# Patient Record
Sex: Female | Born: 1935
Health system: Southern US, Community
[De-identification: ages and names within clinical notes are randomized; demographics above are authoritative.]

## PROBLEM LIST (undated history)

## (undated) DIAGNOSIS — I10 Essential (primary) hypertension: Secondary | ICD-10-CM

## (undated) DIAGNOSIS — M810 Age-related osteoporosis without current pathological fracture: Secondary | ICD-10-CM

## (undated) DIAGNOSIS — M1990 Unspecified osteoarthritis, unspecified site: Secondary | ICD-10-CM

## (undated) HISTORY — PX: CHOLECYSTECTOMY: SHX55

## (undated) HISTORY — PX: OVARIAN CYST REMOVAL: SHX89

## (undated) HISTORY — DX: Age-related osteoporosis without current pathological fracture: M81.0

## (undated) HISTORY — PX: EYE SURGERY: SHX253

## (undated) HISTORY — DX: Unspecified osteoarthritis, unspecified site: M19.90

## (undated) HISTORY — PX: APPENDECTOMY: SHX54

## (undated) HISTORY — DX: Essential (primary) hypertension: I10

## (undated) HISTORY — PX: TONSILLECTOMY: SUR1361

---

## 1999-06-30 ENCOUNTER — Other Ambulatory Visit: Admission: RE | Admit: 1999-06-30 | Discharge: 1999-06-30 | Payer: Self-pay | Admitting: Gynecology

## 1999-11-11 ENCOUNTER — Encounter: Admission: RE | Admit: 1999-11-11 | Discharge: 1999-11-11 | Payer: Self-pay | Admitting: Urology

## 1999-11-11 ENCOUNTER — Encounter: Payer: Self-pay | Admitting: Urology

## 2001-02-04 ENCOUNTER — Ambulatory Visit (HOSPITAL_COMMUNITY): Admission: RE | Admit: 2001-02-04 | Discharge: 2001-02-04 | Payer: Self-pay | Admitting: Internal Medicine

## 2001-05-31 ENCOUNTER — Ambulatory Visit (HOSPITAL_COMMUNITY): Admission: RE | Admit: 2001-05-31 | Discharge: 2001-05-31 | Payer: Self-pay | Admitting: Internal Medicine

## 2001-05-31 ENCOUNTER — Encounter: Payer: Self-pay | Admitting: Internal Medicine

## 2001-09-29 ENCOUNTER — Encounter: Payer: Self-pay | Admitting: *Deleted

## 2001-09-29 ENCOUNTER — Ambulatory Visit (HOSPITAL_COMMUNITY): Admission: RE | Admit: 2001-09-29 | Discharge: 2001-09-29 | Payer: Self-pay | Admitting: *Deleted

## 2002-07-31 ENCOUNTER — Other Ambulatory Visit: Admission: RE | Admit: 2002-07-31 | Discharge: 2002-07-31 | Payer: Self-pay | Admitting: Gynecology

## 2003-01-30 ENCOUNTER — Ambulatory Visit (HOSPITAL_COMMUNITY): Admission: RE | Admit: 2003-01-30 | Discharge: 2003-01-30 | Payer: Self-pay | Admitting: Preventative Medicine

## 2003-01-30 ENCOUNTER — Encounter: Payer: Self-pay | Admitting: Preventative Medicine

## 2003-08-27 ENCOUNTER — Other Ambulatory Visit: Admission: RE | Admit: 2003-08-27 | Discharge: 2003-08-27 | Payer: Self-pay | Admitting: Gynecology

## 2003-11-20 ENCOUNTER — Ambulatory Visit (HOSPITAL_COMMUNITY): Admission: RE | Admit: 2003-11-20 | Discharge: 2003-11-20 | Payer: Self-pay | Admitting: Neurology

## 2004-07-08 ENCOUNTER — Ambulatory Visit (HOSPITAL_COMMUNITY): Admission: RE | Admit: 2004-07-08 | Discharge: 2004-07-08 | Payer: Self-pay | Admitting: Internal Medicine

## 2004-08-28 ENCOUNTER — Other Ambulatory Visit: Admission: RE | Admit: 2004-08-28 | Discharge: 2004-08-28 | Payer: Self-pay | Admitting: Gynecology

## 2006-01-19 ENCOUNTER — Ambulatory Visit: Payer: Self-pay | Admitting: Internal Medicine

## 2006-02-03 ENCOUNTER — Ambulatory Visit (HOSPITAL_COMMUNITY): Admission: RE | Admit: 2006-02-03 | Discharge: 2006-02-03 | Payer: Self-pay | Admitting: Internal Medicine

## 2006-02-04 ENCOUNTER — Ambulatory Visit: Payer: Self-pay | Admitting: Internal Medicine

## 2006-02-04 ENCOUNTER — Ambulatory Visit (HOSPITAL_COMMUNITY): Admission: RE | Admit: 2006-02-04 | Discharge: 2006-02-04 | Payer: Self-pay | Admitting: Internal Medicine

## 2006-02-22 ENCOUNTER — Encounter (HOSPITAL_COMMUNITY): Admission: RE | Admit: 2006-02-22 | Discharge: 2006-03-24 | Payer: Self-pay | Admitting: Internal Medicine

## 2006-06-11 ENCOUNTER — Encounter (INDEPENDENT_AMBULATORY_CARE_PROVIDER_SITE_OTHER): Payer: Self-pay | Admitting: Specialist

## 2006-06-11 ENCOUNTER — Ambulatory Visit (HOSPITAL_COMMUNITY): Admission: RE | Admit: 2006-06-11 | Discharge: 2006-06-11 | Payer: Self-pay | Admitting: General Surgery

## 2007-07-12 ENCOUNTER — Ambulatory Visit (HOSPITAL_COMMUNITY): Admission: RE | Admit: 2007-07-12 | Discharge: 2007-07-12 | Payer: Self-pay | Admitting: Internal Medicine

## 2008-02-28 ENCOUNTER — Emergency Department (HOSPITAL_COMMUNITY): Admission: EM | Admit: 2008-02-28 | Discharge: 2008-02-28 | Payer: Self-pay | Admitting: Emergency Medicine

## 2009-08-13 ENCOUNTER — Ambulatory Visit (HOSPITAL_COMMUNITY): Admission: RE | Admit: 2009-08-13 | Discharge: 2009-08-13 | Payer: Self-pay | Admitting: Ophthalmology

## 2009-08-27 ENCOUNTER — Ambulatory Visit (HOSPITAL_COMMUNITY): Admission: RE | Admit: 2009-08-27 | Discharge: 2009-08-27 | Payer: Self-pay | Admitting: Ophthalmology

## 2010-05-11 ENCOUNTER — Encounter: Payer: Self-pay | Admitting: Internal Medicine

## 2010-07-08 LAB — BASIC METABOLIC PANEL
BUN: 20 mg/dL (ref 6–23)
CO2: 25 mEq/L (ref 19–32)
Calcium: 8.9 mg/dL (ref 8.4–10.5)
GFR calc Af Amer: >60 mL/min (ref >60)
Glucose, Bld: 90 mg/dL (ref 70–99)
Potassium: 4.1 mEq/L (ref 3.5–5.1)

## 2010-07-08 LAB — HEMOGLOBIN AND HEMATOCRIT, BLOOD
HCT: 38.6 % (ref 36.0–46.0)
Hemoglobin: 13.5 g/dL (ref 12.0–15.0)

## 2010-09-05 NOTE — Op Note (Signed)
[facility]  Patient:    Rebecca Cordova, Rebecca Cordova [facility] T Visit Number: [id number] MRN: [id number]          Service Type: DSU Location: DAY Attending Physician:  Jonathon Bellows Dictated by:   Roetta Sessions, M.D. Proc. Date: 02/04/01 Admit Date:  02/04/2001 Discharge Date: 02/04/2001   CC:         Carylon Perches, M.D.   Operative Report  PROCEDURE:  Esophagogastroduodenoscopy with [facility] dilation.  ENDOSCOPIST:  Roetta Sessions, M.D.  INDICATION FOR PROCEDURE:  Patient is a 77 year old lady with gastroesophageal reflux symptoms and esophageal dysphagia.  She has been on Nexium 40 mg orally daily recently with improvement in her symptoms.  This approach has been discussed with Ms. Somma.  Potential risks, benefits and alternatives have been reviewed, questions answered and she is agreeable.  Please see my handwritten H&P for more information.  PROCEDURE NOTE:  O2 saturation, blood pressure, pulse and respirations were monitored throughout the entire procedure.  CONSCIOUS SEDATION:  Versed 3 mg IV, Demerol 75 mg IV in divided doses; Cetacaine spray for topical oropharyngeal anesthesia.  INSTRUMENT:  Olympus gastroscope.  FINDINGS:  Examination of the tubular esophagus revealed a couple of tiny distal esophageal erosions and a Schatzkis ring.  No other mucosal abnormalities were noted.  EG junction was easily traversed.  Stomach:  Gastric cavity insufflated well with air.  Thorough examination of the gastric mucosa including a retroflexed view of the proximal stomach and esophagogastric junction demonstrated no abnormalities.  Pyloric channel was patent and easily traversed.  Duodenum:  Bulb and second portion appeared normal.  Therapeutic/diagnostic maneuvers performed:  A 56-French Maloney dilator was passed to full insertion with ease and withdrawn.  I looked back and the esophagus and stomach revealed no apparent complication related to passage of the  dilator.  Patient tolerated the procedure well and was reacted at endoscopy.  IMPRESSION:  A couple of tiny distal esophageal erosions consistent with mild erosive reflux esophagitis; Schatzkis ring, status post dilation as described above; remainder of her upper gastrointestinal tract appeared normal.  RECOMMENDATIONS:  Antireflux measures, continue Nexium 40 mg orally daily, follow up with Dr. Carylon Perches. Dictated by:   Roetta Sessions, M.D. Attending Physician:  Jonathon Bellows DD:  02/07/01 TD:  02/08/01 Job: [id number]

## 2010-09-05 NOTE — Op Note (Signed)
NAMEKayani Cordova, Rebecca Cordova                  ACCOUNT NO.:  [id number]   MEDICAL RECORD NO.:  [id number]          PATIENT TYPE:  AMB   LOCATION:  DAY                           FACILITY:  [facility]   PHYSICIAN:  Dalia Heading, M.D.  DATE OF BIRTH:  04/05/1935   DATE OF PROCEDURE:  06/11/2006  DATE OF DISCHARGE:                               OPERATIVE REPORT   PREOPERATIVE DIAGNOSIS:  Chronic cholecystitis.   POSTOPERATIVE DIAGNOSIS:  Chronic cholecystitis.   PROCEDURE:  Laparoscopic cholecystectomy.   SURGEON:  Dalia Heading, M.D.   ANESTHESIA:  General endotracheal.   INDICATIONS FOR PROCEDURE:  The patient is a 77 year old white female  who was referred for biliary colic secondary to chronic cholecystitis.  The risks and benefits of the procedure including bleeding, infection,  hepatobiliary injury, and the possibly of an open procedure were fully  explained to the patient who gave informed consent.   DESCRIPTION OF PROCEDURE:  The patient was placed in the supine  position.  After induction of general endotracheal anesthesia, the  abdomen was prepped and draped using the usual sterile technique with  Betadine.  Surgical site confirmation was performed.   An infraumbilical incision was made down to the fascia.  A Veress needle  was introduced into the abdominal cavity, and confirmation of placement  was done using the saline drop test.  The abdomen was then insufflated  to 16 mmHg pressure.  A 11-mm trocar was introduced into the abdominal  cavity under direct visualization without difficulty.  The patient was  placed in reverse Trendelenburg position, and an additional 11-mm trocar  was placed in the epigastric region, and 5-mm trocars were placed in the  right upper quadrant and right flank regions.  The liver was inspected  and noted to be within normal limits.  The gallbladder was retracted  superior and laterally.  The dissection was begun around the  infundibulum of the  gallbladder.  The cystic duct was first identified.  Its juncture to the infundibulum was fully identified.  Endoclips were  placed proximally and distally on the cystic duct, and the cystic duct  was divided.  This was likewise done on the cystic artery.  The  gallbladder was then freed away from the gallbladder fossa using Bovie  electrocautery.  The gallbladder was delivered through the epigastric  trocar site using an EndoCatch bag.  The gallbladder fossa was  inspected, and no abnormal bleeding or bile leakage was noted.  Surgicel  was placed in the gallbladder fossa.  All fluid and air were then  evacuated from the abdominal cavity prior to removal of the trocars.   All wounds were irrigated with normal saline.  All wounds were injected  with 0.5% Sensorcaine.  The infraumbilical fascia was reapproximated  using an 0 Vicryl interrupted suture.  All skin incisions were closed  using staples.  Betadine ointment and dry sterile dressings were  applied.   All tape and needle counts were correct at the end of the procedure.  The patient was extubated in the operating room and went back to the  recovery room awake and in stable condition.   COMPLICATIONS:  None.   SPECIMENS:  Gallbladder.   ESTIMATED BLOOD LOSS:  Minimal.      Dalia Heading, M.D.  Electronically Signed     MAJ/MEDQ  D:  06/11/2006  T:  06/11/2006  Job:  [id number]   cc:   Kingsley Callander. Ouida Sills, MD  Fax: [phone]   R. Roetta Sessions, M.D.  [address]  [place]  [place] [address]

## 2010-09-05 NOTE — H&P (Signed)
NAMEGiulietta Cordova, [name]                  ACCOUNT NO.:  [id number]   MEDICAL RECORD NO.:  [id number]          PATIENT TYPE:  AMB   LOCATION:  DAY                           FACILITY:  [facility]   PHYSICIAN:  Dalia Heading, M.D.  DATE OF BIRTH:  04/05/1935   DATE OF ADMISSION:  DATE OF DISCHARGE:                              HISTORY & PHYSICAL   CHIEF COMPLAINT:  Chronic cholecystitis.   HISTORY OF PRESENT ILLNESS:  The patient is a 77 year old white female  who is referred for evaluation and treatment of biliary colic secondary  to chronic cystitis.  She has been having right upper quadrant abdominal  pain, nausea, bloating for many months.  She does have fatty food  intolerance.  No fever, chills, jaundice have been noted.   PAST MEDICAL HISTORY:  Includes hypertension.   PAST SURGICAL HISTORY:  Ovarian cyst removal.   CURRENT MEDICATIONS:  Altace, Nexium.   ALLERGIES:  PENICILLIN.   REVIEW OF SYSTEMS:  This patient smokes less than a half a pack  cigarettes a day.  She denies any alcohol use.  She denies any other  cardiopulmonary difficulties or bleeding disorders.   PHYSICAL EXAMINATION:  GENERAL:  The patient is a well-developed, well-  nourished white female in no acute distress.  HEENT EXAMINATION:  Reveals no scleral icterus.  LUNGS:  Clear to auscultation with equal breath sounds bilaterally.  HEART EXAMINATION:  Reveals a regular rate and rhythm without S3, S4,  murmurs.  ABDOMEN:  Soft, nontender, nondistended.  No hepatosplenomegaly, masses,  hernias identified.  Ultrasound of the gallbladder is negative.  Hepatobiliary tree scan reveals chronic cholecystis with a low  gallbladder ejection fraction.   IMPRESSION:  Chronic cholecystis.   PLAN:  The patient is scheduled for laparoscopic cholecystectomy on  June 11, 2006.  The risks and benefits of the procedure including  bleeding, infection, hepatobiliary injury, and the possibility of an  open procedure were fully  explained to the patient, gave informed  consent.      Dalia Heading, M.D.  Electronically Signed     MAJ/MEDQ  D:  06/08/2006  T:  06/09/2006  Job:  [id number]   cc:   Short Stay Wiliam Ke Ouida Sills, MD  Leonidas Romberg

## 2010-09-05 NOTE — Op Note (Signed)
[name], [name]                  ACCOUNT NO.:  [id number]   MEDICAL RECORD NO.:  [id number]          PATIENT TYPE:  AMB   LOCATION:  DAY                           FACILITY:  [facility]   PHYSICIAN:  R. Roetta Sessions, M.D. DATE OF BIRTH:  04/05/1935   DATE OF PROCEDURE:  02/03/2006  DATE OF DISCHARGE:                                 OPERATIVE REPORT   Diagnostic esophagogastroduodenoscopy followed by colonoscopy.   INDICATIONS FOR PROCEDURE:  The patient is a 77 year old lady with a vague  upper abdominal discomfort, some recurrent reflux symptoms off acid  suppression recently.  She had been taking Fosamax, was also having low-  volume painless hematochezia.  Last colonoscopy was some 6 years ago.  EGD  and colonoscopy are now being done.  This approach has been discussed with  the patient at length.  Potential risks, benefits and alternatives have been  reviewed, questions answered.  She is agreeable.  Please see the  documentation in the medical record.   PROCEDURE NOTE:  O2 saturation, blood pressure, pulse, and respirations were  monitored throughout the entire procedure.  Conscious sedation Versed 4 mg  IV, Demerol 75 mg IV in divided doses.   INSTRUMENT:  Olympus video chip system.   FINDINGS:  EGD:  Examination of the tubular esophagus revealed no mucosal  abnormalities.  EG junction was easily traversed.   Stomach:  The gastric cavity was empty and insufflated well with air.  Throughout examination of the gastric mucosa with retroflexed view of the  proximal stomach and esophagogastric junction demonstrated a moderate-size  hiatal hernia.  Pylorus was patent and easily traversed. Examination of the  bulb and second portion revealed no abnormalities.   THERAPEUTIC/DIAGNOSTIC MANEUVERS:  None.   The patient tolerated the procedure well, and she was prepared for  colonoscopy.  Digital rectal exam revealed no abnormalities.   ENDOSCOPIC FINDINGS:  The prep was good.   Rectum:  Examination of the rectal mucosa including retroflexed view of the  anal verge including en face view of the anal canal demonstrated some  friable anal canal hemorrhoids. Otherwise rectal mucosa appeared entirely  normal.   Colon:  Colonic mucosa was surveyed from the rectosigmoid junction through  the left, transverse and right colon to the area of the appendiceal orifice,  ileocecal valve and cecum.  These structures well seen and photographed for  the record.  From this level, scope was slowly cautiously withdrawn, and all  previously mentioned mucosal surfaces were again seen.  The colonic mucosa  appeared normal.  The patient tolerated both procedures well and was  reactive to endoscopy.   IMPRESSION:  Esophagogastroduodenoscopy:  Normal esophagus, moderately large  sized hiatal hernia, otherwise normal stomach, D1 and D2.   Colonoscopy findings:  Friable anal canal/hemorrhoids, otherwise normal  rectum and normal colon.   RECOMMENDATIONS:  1. Will continue Nexium.  2. Will proceed with gallbladder evaluation via ultrasound.  3. Hemorrhoid literature provided to Ms. Eichelberger.  4. Ten-day course of Anusol HC suppositories 1 per rectum at bedtime.  5. Further recommendations to follow.  Jonathon Bellows, M.D.  Electronically Signed     RMR/MEDQ  D:  02/03/2006  T:  02/04/2006  Job:  [id number]   cc:   Kingsley Callander. Ouida Sills, MD  Fax: [phone]

## 2010-09-05 NOTE — H&P (Signed)
NAME:  Rebecca Cordova, Rebecca Cordova                  ACCOUNT NO.:  [id number]   MEDICAL RECORD NO.:  [id number]           PATIENT TYPE:  AMB   LOCATION:  DAY                           FACILITY:  [facility]   PHYSICIAN:  R. Roetta Sessions, M.D. DATE OF BIRTH:  04/05/1935   DATE OF ADMISSION:  DATE OF DISCHARGE:  LH                                HISTORY & PHYSICAL   CHIEF COMPLAINT:  Upper abdominal pain; intermittent rectal bleeding.   HISTORY OF PRESENT ILLNESS:  Ms. Airyanna Dipalma is a pleasant 77 year old  Caucasian female followed primarily by Dr. Carylon Perches, who came to see me  with a complaint of a several-month history of vague upper abdominal pain.  She has a history of gastroesophageal reflux disease; those symptoms were  well-controlled on Nexium.  She stopped the Nexium a few weeks ago and noted  recurrence of her reflux symptoms.  She has not had any odynophagia or  dysphagia.  She wipes a small amount of blood per rectum on occasion.  She  denies constipation or diarrhea.  Upper abdominal discomfort really does not  necessarily have a postprandial component.  She denies any weight loss, but  states she weighed 160 pounds back in 2003.  She had been taking Fosamax for  osteoporosis, but stopped her medication, thinking it was aggravating her  condition.  She underwent a colonoscopy by me for blood per rectum back in  2001; she was found to have internal hemorrhoid, otherwise a normal rectum  and colon.  She had complained of dysphagia to Dr. Ouida Sills back in 2002 and we  had set her up for an EGD on February 04, 2001.  She did undergo an EGD,  which revealed a couple of distal tiny erosions consistent with mild reflux  esophagitis.  She was found to have a Schatzki's ring.  A 56-French Maloney  dilator was passed; this was associated with resolution in her dysphagia.  She again presented in December 2003 with recurrent rectal bleeding.  My  plan was to perform a sigmoidoscopy on this nice lady to further  evaluate  her distal lower GI tract; she failed to follow through on the examination.  Her gallbladder remains in situ.  She has not had any imaging studies  recently.   Family history is significant for:  Her father succumbed to stomach  cancer, which may have been colon, at age 72.  She has a history of an  uncle and cousin with colon cancer.   PAST MEDICAL HISTORY:  1. Osteoporosis.  2. Gastroesophageal reflux disease.  3. Hypertension.   PAST SURGERY:  1. Appendectomy.  2. Ovarian cyst surgery.  3. EGD and colonoscopy as outlined above.   CURRENT MEDICATIONS:  1. Nexium 40 mg orally daily.  2. Altace 2.5 mg daily.  3. ASA 81 mg daily.  4. Fosamax recently discontinued.   ALLERGIES:  PENICILLIN.   FAMILY HISTORY:  Mother died at age 84, cause unknown.  Father died of a GI  malignancy as outlined above, as did uncle and cousin.   SOCIAL HISTORY:  The  patient is single.  She has 1 child.  She works for  [employer].  She has a dog and 4 cats at home.  She  smokes 6-7 cigarettes a day, has a live-in boyfriend.  No alcohol or illicit  drugs.   REVIEW OF SYSTEMS:  Some weight loss since 2001, none recently.  No fever or  chills.  Dyspnea and chest pain on exertion.  She has not really had any  early satiety, nausea or vomiting, esophageal dysphagia.   PHYSICAL EXAMINATION:  GENERAL:  Physical examination reveals a fretful,  anxious-appearing 77 year old lady.  VITAL SIGNS:  Weight 148, height 5 feet 6.  Temperature 98, BP 148/80, pulse  76.  SKIN:  Warm and dry.  No jaundice.  No cutaneous stigmata of chronic liver  disease.  HEENT:  No scleral icterus.  Oral cavity:  No lesions.  CHEST:  Lungs are clear to auscultation.  HEART:  Regular rate and rhythm without murmur, gallop or rub.  BREASTS:  Deferred.  ABDOMEN:  Non-distended.  Positive bowel sounds.  She has some vague  epigastric tenderness to palpation, no appreciable mass or  organomegaly.  EXTREMITIES:  No edema.  RECTAL:  Deferred until the time of colonoscopy.   IMPRESSION:  Ms. Anely Spiewak is a pleasant 77 year old lady with a several-  week history of vague upper abdominal discomfort that falls into the realm  of dyspepsia.  She has recurrence of reflux symptoms off acid suppression  therapy recently.  She was also taking Fosamax, which has been discontinued  and she has essentially paper hematochezia.  Findings on prior colonoscopy  some 6 years ago are somewhat reassuring.  She has a positive family history  of colon cancer, possibly gastric carcinoma.  Her symptoms do not really  sound necessarily biliary.  She is not taking an nonsteroidal anti-  inflammatory drugs, as she reports.  She needs further evaluation.   RECOMMENDATIONS:  I have offered Ms. Sunderland an EGD and a colonoscopy at the  same time in the near future at [facility].  The potential risks,  benefits and alternatives have been reviewed and questions answered; she is  agreeable.  I will go ahead and give her some samples of Nexium and ask her  to start back on Nexium 40 mg orally daily and I will make further  recommendations in the very near future.      Jonathon Bellows, M.D.  Electronically Signed     RMR/MEDQ  D:  01/19/2006  T:  01/20/2006  Job:  [id number]   cc:   Kingsley Callander. Ouida Sills, MD  Fax: [phone]

## 2010-12-09 ENCOUNTER — Ambulatory Visit (HOSPITAL_COMMUNITY)
Admission: RE | Admit: 2010-12-09 | Discharge: 2010-12-09 | Disposition: A | Discharge status: Home / Self Care | Payer: Medicare Other | Source: Ambulatory Visit | Attending: Preventative Medicine | Admitting: Preventative Medicine

## 2010-12-09 ENCOUNTER — Other Ambulatory Visit (HOSPITAL_COMMUNITY): Payer: Self-pay | Admitting: Preventative Medicine

## 2010-12-09 DIAGNOSIS — H538 Other visual disturbances: Secondary | ICD-10-CM

## 2010-12-09 DIAGNOSIS — T1490XA Injury, unspecified, initial encounter: Secondary | ICD-10-CM

## 2010-12-09 DIAGNOSIS — R42 Dizziness and giddiness: Secondary | ICD-10-CM

## 2010-12-09 DIAGNOSIS — R51 Headache: Secondary | ICD-10-CM | POA: Insufficient documentation

## 2011-01-19 ENCOUNTER — Other Ambulatory Visit: Payer: Self-pay | Admitting: Gynecology

## 2011-01-19 DIAGNOSIS — R928 Other abnormal and inconclusive findings on diagnostic imaging of breast: Secondary | ICD-10-CM

## 2011-01-20 LAB — DIFFERENTIAL
Basophils Absolute: 0
Basophils Relative: 0
Eosinophils Absolute: 0
Eosinophils Relative: 0
Monocytes Absolute: 0.2
Neutro Abs: 14.3 — ABNORMAL HIGH
Neutrophils Relative %: 93 — ABNORMAL HIGH

## 2011-01-20 LAB — CBC
HCT: 44.2
Hemoglobin: 14.8
MCV: 94

## 2011-01-20 LAB — COMPREHENSIVE METABOLIC PANEL
Alkaline Phosphatase: 40
Chloride: 105
GFR calc Af Amer: >60
Glucose, Bld: 128 — ABNORMAL HIGH
Potassium: 4.4
Total Bilirubin: 0.6

## 2011-01-20 LAB — URINE MICROSCOPIC-ADD ON

## 2011-01-20 LAB — URINE CULTURE
Colony Count: NO GROWTH
Culture: NO GROWTH

## 2011-01-20 LAB — URINALYSIS, ROUTINE W REFLEX MICROSCOPIC
Protein, ur: NEGATIVE
Urobilinogen, UA: 0.2
pH: 5

## 2011-01-28 ENCOUNTER — Other Ambulatory Visit: Payer: Self-pay | Admitting: Gynecology

## 2011-01-28 ENCOUNTER — Ambulatory Visit
Admission: RE | Admit: 2011-01-28 | Discharge: 2011-01-28 | Disposition: A | Discharge status: Home / Self Care | Payer: Medicare Other | Source: Ambulatory Visit | Attending: Gynecology | Admitting: Gynecology

## 2011-01-28 DIAGNOSIS — R928 Other abnormal and inconclusive findings on diagnostic imaging of breast: Secondary | ICD-10-CM

## 2011-01-30 ENCOUNTER — Ambulatory Visit (INDEPENDENT_AMBULATORY_CARE_PROVIDER_SITE_OTHER): Payer: Medicare Other | Admitting: Urology

## 2011-01-30 ENCOUNTER — Other Ambulatory Visit: Payer: Self-pay | Admitting: Urology

## 2011-01-30 DIAGNOSIS — N200 Calculus of kidney: Secondary | ICD-10-CM

## 2011-01-30 DIAGNOSIS — N281 Cyst of kidney, acquired: Secondary | ICD-10-CM

## 2011-01-30 DIAGNOSIS — R3129 Other microscopic hematuria: Secondary | ICD-10-CM

## 2011-02-03 ENCOUNTER — Ambulatory Visit (HOSPITAL_COMMUNITY)
Admission: RE | Admit: 2011-02-03 | Discharge: 2011-02-03 | Disposition: A | Discharge status: Home / Self Care | Payer: Medicare Other | Source: Ambulatory Visit | Attending: Urology | Admitting: Urology

## 2011-02-03 ENCOUNTER — Encounter (HOSPITAL_COMMUNITY): Payer: Self-pay

## 2011-02-03 DIAGNOSIS — R9389 Abnormal findings on diagnostic imaging of other specified body structures: Secondary | ICD-10-CM | POA: Insufficient documentation

## 2011-02-03 DIAGNOSIS — N200 Calculus of kidney: Secondary | ICD-10-CM | POA: Insufficient documentation

## 2011-02-03 DIAGNOSIS — R3129 Other microscopic hematuria: Secondary | ICD-10-CM | POA: Insufficient documentation

## 2011-02-03 MED ORDER — IOHEXOL 300 MG/ML  SOLN
125.0000 mL | Freq: Once | INTRAMUSCULAR | Status: AC | PRN
  Administered 2011-02-03: 125 mL via INTRAVENOUS

## 2011-02-03 MED ORDER — IOHEXOL 300 MG/ML  SOLN: 80.0000 mL | Freq: Once | INTRAMUSCULAR | Status: DC | PRN

## 2011-03-06 ENCOUNTER — Ambulatory Visit (INDEPENDENT_AMBULATORY_CARE_PROVIDER_SITE_OTHER): Payer: Medicare Other | Admitting: Urology

## 2011-03-06 DIAGNOSIS — N200 Calculus of kidney: Secondary | ICD-10-CM

## 2011-03-06 DIAGNOSIS — R3129 Other microscopic hematuria: Secondary | ICD-10-CM

## 2011-06-29 DIAGNOSIS — N812 Incomplete uterovaginal prolapse: Secondary | ICD-10-CM | POA: Diagnosis not present

## 2011-07-27 DIAGNOSIS — F411 Generalized anxiety disorder: Secondary | ICD-10-CM | POA: Diagnosis not present

## 2011-07-27 DIAGNOSIS — G459 Transient cerebral ischemic attack, unspecified: Secondary | ICD-10-CM | POA: Diagnosis not present

## 2011-08-27 DIAGNOSIS — N760 Acute vaginitis: Secondary | ICD-10-CM | POA: Diagnosis not present

## 2011-09-07 DIAGNOSIS — N760 Acute vaginitis: Secondary | ICD-10-CM | POA: Diagnosis not present

## 2011-12-14 DIAGNOSIS — N951 Menopausal and female climacteric states: Secondary | ICD-10-CM | POA: Diagnosis not present

## 2011-12-14 DIAGNOSIS — N760 Acute vaginitis: Secondary | ICD-10-CM | POA: Diagnosis not present

## 2011-12-21 ENCOUNTER — Emergency Department (HOSPITAL_COMMUNITY)
Admission: EM | Admit: 2011-12-21 | Discharge: 2011-12-21 | Disposition: A | Discharge status: Home / Self Care | Payer: Medicare Other | Attending: Emergency Medicine | Admitting: Emergency Medicine

## 2011-12-21 ENCOUNTER — Encounter (HOSPITAL_COMMUNITY): Payer: Self-pay | Admitting: Family Medicine

## 2011-12-21 ENCOUNTER — Emergency Department (HOSPITAL_COMMUNITY): Payer: Medicare Other

## 2011-12-21 DIAGNOSIS — S60229A Contusion of unspecified hand, initial encounter: Secondary | ICD-10-CM | POA: Diagnosis not present

## 2011-12-21 DIAGNOSIS — Z880 Allergy status to penicillin: Secondary | ICD-10-CM | POA: Insufficient documentation

## 2011-12-21 DIAGNOSIS — Z9089 Acquired absence of other organs: Secondary | ICD-10-CM | POA: Diagnosis not present

## 2011-12-21 DIAGNOSIS — S60221A Contusion of right hand, initial encounter: Secondary | ICD-10-CM

## 2011-12-21 DIAGNOSIS — Z87891 Personal history of nicotine dependence: Secondary | ICD-10-CM | POA: Diagnosis not present

## 2011-12-21 DIAGNOSIS — S6990XA Unspecified injury of unspecified wrist, hand and finger(s), initial encounter: Secondary | ICD-10-CM | POA: Diagnosis not present

## 2011-12-21 DIAGNOSIS — W2209XA Striking against other stationary object, initial encounter: Secondary | ICD-10-CM | POA: Insufficient documentation

## 2011-12-21 NOTE — ED Provider Notes (Signed)
History     CSN: [id number]  Arrival date & time 12/21/11  0911   First MD Initiated Contact with Patient 12/21/11 [phone]      Chief Complaint  Patient presents with  . Hand Injury    (Consider location/radiation/quality/duration/timing/severity/associated sxs/prior treatment) HPI Comments: Patient is a 78 year old female who. Her right hand on a" a door jam" on yesterday September 1. The patient states that while adjusting the door to a dog kennel her hand slipped and she hit a doorjamb with a good deal of force. Patient states she heard a pop. She had increased pain on yesterday and presents today for evaluation if she is noted increased swelling. The patient denies any previous injury or procedure to the right hand. The patient denies using any blood thinning type medications and denies having any bleeding disorders.  Patient is a 78 y.o. female presenting with hand injury. The history is provided by the patient.  Hand Injury     No past medical history on file.  Past Surgical History  Procedure Date  . Tonsillectomy   . Appendectomy   . Ovarian cyst removal   . Cholecystectomy   . Eye surgery     No family history on file.  History  Substance Use Topics  . Smoking status: Former Smoker -- 20 years    Types: Cigarettes    Quit date: 12/21/2006  . Smokeless tobacco: Not on file  . Alcohol Use: No    OB History    Grav Para Term Preterm Abortions TAB SAB Ect Mult Living                  Review of Systems  Constitutional: Negative for activity change.       All ROS Neg except as noted in HPI  HENT: Negative for nosebleeds and neck pain.   Eyes: Positive for pain. Negative for photophobia and discharge.  Respiratory: Negative for cough, shortness of breath and wheezing.   Cardiovascular: Negative for chest pain and palpitations.  Gastrointestinal: Negative for abdominal pain and blood in stool.  Genitourinary: Negative for dysuria, frequency and hematuria.    Musculoskeletal: Positive for arthralgias. Negative for back pain.  Skin: Negative.   Neurological: Negative for dizziness, seizures and speech difficulty.  Psychiatric/Behavioral: Negative for hallucinations and confusion.    Allergies  Penicillins  Home Medications   Current Outpatient Rx  Name Route Sig Dispense Refill  . ACETAMINOPHEN 500 MG PO TABS Oral Take 1,000 mg by mouth every 6 (six) hours as needed. Pain    . ADULT MULTIVITAMIN W/MINERALS CH Oral Take 1 tablet by mouth daily.    Jeananne Rama SULFATE 0.05-0.25 % OP SOLN Both Eyes Place 2 drops into both eyes 3 (three) times daily as needed. Itchy/Red Eyes      BP 138/66  Pulse 66  Temp 97.5 F (36.4 C) (Oral)  Resp 16  Ht 5\' 6"  (1.676 m)  Wt 140 lb (63.504 kg)  BMI 22.60 kg/m2  SpO2 98%  Physical Exam  Nursing note and vitals reviewed. Constitutional: She is oriented to person, place, and time. She appears well-developed and well-nourished.  Non-toxic appearance.  HENT:  Head: Normocephalic.  Right Ear: Tympanic membrane and external ear normal.  Left Ear: Tympanic membrane and external ear normal.  Eyes: EOM and lids are normal. Pupils are equal, round, and reactive to light.  Neck: Normal range of motion. Neck supple. Carotid bruit is not present.  Cardiovascular: Normal rate, regular rhythm, normal heart  sounds, intact distal pulses and normal pulses.   Pulmonary/Chest: Breath sounds normal. No respiratory distress.       Rhonchi present  Abdominal: Soft. Bowel sounds are normal. There is no tenderness. There is no guarding.  Musculoskeletal: Normal range of motion.       There are multiple degenerative changes of the right and left hand. There is swelling at the MP joint area extending into the dorsum of the hand at the first second and third MP joint areas. There is fair range of motion of the fingers. There is full range of motion of the right wrist. There is no pain in the anatomical snuff box.  There is full range of motion of the right elbow and shoulder. Capillary refill is less than 3 seconds.  Lymphadenopathy:       Head (right side): No submandibular adenopathy present.       Head (left side): No submandibular adenopathy present.    She has no cervical adenopathy.  Neurological: She is alert and oriented to person, place, and time. She has normal strength. No cranial nerve deficit or sensory deficit.  Skin: Skin is warm and dry.  Psychiatric: She has a normal mood and affect. Her speech is normal.    ED Course  Procedures (including critical care time)  Labs Reviewed - No data to display Dg Hand Complete Right  12/21/2011  *RADIOLOGY REPORT*  Clinical Data: Injury to right hand.  RIGHT HAND - COMPLETE 3+ VIEW  Comparison: None.  Findings: No acute fracture or dislocation identified.  There is evidence of significant osteoarthritis involving the first through third digits.  Pattern at the level of the interphalangeal joints is suggestive of erosive osteoarthritis.  No focal bony lesions. Soft tissues are unremarkable.  IMPRESSION: No acute fracture.  Evidence of erosive osteoarthritis.   Original Report Authenticated By: Reola Calkins, M.D.      1. Contusion of right hand       MDM  I have reviewed nursing notes, vital signs, and all appropriate lab and imaging results for this patient.  X-ray of the right hand reveals no acute fracture or dislocation there is significant osteoarthritis changes present with some erosive changes particularly at the interphalangeal joints. The patient has been given the results of the test. She is treated with a Lenora Boys splint and ice pack. The patient is advised to see her primary physician or orthopedics if not improving.      Kathie Dike, [place] 12/21/11 1104

## 2011-12-21 NOTE — ED Notes (Signed)
Pt. C/o right hand pain and swelling after hitting hand on a door jam yesterday.  Right hand presents with edema. No obvious deformity noted.

## 2011-12-21 NOTE — ED Provider Notes (Signed)
Medical screening examination/treatment/procedure(s) were performed by non-physician practitioner and as supervising physician I was immediately available for consultation/collaboration.   Keirah Konitzer, MD 12/21/11 1500 

## 2011-12-21 NOTE — ED Notes (Signed)
Patient with no complaints at this time. Respirations even and unlabored. Skin warm/dry. Discharge instructions reviewed with patient at this time. Patient given opportunity to voice concerns/ask questions. Patient discharged at this time and left Emergency Department with steady gait.   

## 2012-02-03 DIAGNOSIS — E785 Hyperlipidemia, unspecified: Secondary | ICD-10-CM | POA: Diagnosis not present

## 2012-02-03 DIAGNOSIS — Z79899 Other long term (current) drug therapy: Secondary | ICD-10-CM | POA: Diagnosis not present

## 2012-02-03 DIAGNOSIS — G459 Transient cerebral ischemic attack, unspecified: Secondary | ICD-10-CM | POA: Diagnosis not present

## 2012-02-11 DIAGNOSIS — E785 Hyperlipidemia, unspecified: Secondary | ICD-10-CM | POA: Diagnosis not present

## 2012-02-11 DIAGNOSIS — Z23 Encounter for immunization: Secondary | ICD-10-CM | POA: Diagnosis not present

## 2012-02-11 DIAGNOSIS — G459 Transient cerebral ischemic attack, unspecified: Secondary | ICD-10-CM | POA: Diagnosis not present

## 2012-07-22 ENCOUNTER — Ambulatory Visit: Payer: Medicare Other | Admitting: Urology

## 2012-08-01 DIAGNOSIS — Z1231 Encounter for screening mammogram for malignant neoplasm of breast: Secondary | ICD-10-CM | POA: Diagnosis not present

## 2012-08-01 DIAGNOSIS — Z124 Encounter for screening for malignant neoplasm of cervix: Secondary | ICD-10-CM | POA: Diagnosis not present

## 2012-08-01 DIAGNOSIS — Z1212 Encounter for screening for malignant neoplasm of rectum: Secondary | ICD-10-CM | POA: Diagnosis not present

## 2012-08-02 DIAGNOSIS — E785 Hyperlipidemia, unspecified: Secondary | ICD-10-CM | POA: Diagnosis not present

## 2012-08-08 DIAGNOSIS — E785 Hyperlipidemia, unspecified: Secondary | ICD-10-CM | POA: Diagnosis not present

## 2012-08-15 ENCOUNTER — Other Ambulatory Visit (HOSPITAL_COMMUNITY): Payer: Self-pay | Admitting: Internal Medicine

## 2012-08-15 ENCOUNTER — Ambulatory Visit (HOSPITAL_COMMUNITY)
Admission: RE | Admit: 2012-08-15 | Discharge: 2012-08-15 | Disposition: A | Discharge status: Home / Self Care | Payer: Medicare Other | Source: Ambulatory Visit | Attending: Internal Medicine | Admitting: Internal Medicine

## 2012-08-15 DIAGNOSIS — R079 Chest pain, unspecified: Secondary | ICD-10-CM | POA: Insufficient documentation

## 2012-08-15 DIAGNOSIS — M549 Dorsalgia, unspecified: Secondary | ICD-10-CM

## 2012-08-15 DIAGNOSIS — R0781 Pleurodynia: Secondary | ICD-10-CM

## 2012-08-15 DIAGNOSIS — K449 Diaphragmatic hernia without obstruction or gangrene: Secondary | ICD-10-CM | POA: Insufficient documentation

## 2012-08-15 DIAGNOSIS — E785 Hyperlipidemia, unspecified: Secondary | ICD-10-CM | POA: Diagnosis not present

## 2012-08-15 DIAGNOSIS — G459 Transient cerebral ischemic attack, unspecified: Secondary | ICD-10-CM | POA: Diagnosis not present

## 2012-10-24 DIAGNOSIS — L821 Other seborrheic keratosis: Secondary | ICD-10-CM | POA: Diagnosis not present

## 2012-10-24 DIAGNOSIS — I781 Nevus, non-neoplastic: Secondary | ICD-10-CM | POA: Diagnosis not present

## 2012-10-24 DIAGNOSIS — L739 Follicular disorder, unspecified: Secondary | ICD-10-CM | POA: Diagnosis not present

## 2012-10-24 DIAGNOSIS — L570 Actinic keratosis: Secondary | ICD-10-CM | POA: Diagnosis not present

## 2012-12-05 DIAGNOSIS — R350 Frequency of micturition: Secondary | ICD-10-CM | POA: Diagnosis not present

## 2013-01-02 DIAGNOSIS — N812 Incomplete uterovaginal prolapse: Secondary | ICD-10-CM | POA: Diagnosis not present

## 2013-02-06 DIAGNOSIS — Z23 Encounter for immunization: Secondary | ICD-10-CM | POA: Diagnosis not present

## 2013-02-14 DIAGNOSIS — G459 Transient cerebral ischemic attack, unspecified: Secondary | ICD-10-CM | POA: Diagnosis not present

## 2013-02-21 DIAGNOSIS — H43819 Vitreous degeneration, unspecified eye: Secondary | ICD-10-CM | POA: Diagnosis not present

## 2013-04-18 DIAGNOSIS — J210 Acute bronchiolitis due to respiratory syncytial virus: Secondary | ICD-10-CM | POA: Diagnosis not present

## 2013-05-04 DIAGNOSIS — N812 Incomplete uterovaginal prolapse: Secondary | ICD-10-CM | POA: Diagnosis not present

## 2013-05-09 DIAGNOSIS — N8110 Cystocele, unspecified: Secondary | ICD-10-CM | POA: Diagnosis not present

## 2013-05-22 DIAGNOSIS — L821 Other seborrheic keratosis: Secondary | ICD-10-CM | POA: Diagnosis not present

## 2013-05-22 DIAGNOSIS — L570 Actinic keratosis: Secondary | ICD-10-CM | POA: Diagnosis not present

## 2013-05-22 DIAGNOSIS — D235 Other benign neoplasm of skin of trunk: Secondary | ICD-10-CM | POA: Diagnosis not present

## 2013-05-22 DIAGNOSIS — L723 Sebaceous cyst: Secondary | ICD-10-CM | POA: Diagnosis not present

## 2013-05-23 DIAGNOSIS — N8110 Cystocele, unspecified: Secondary | ICD-10-CM | POA: Diagnosis not present

## 2013-05-31 DIAGNOSIS — N814 Uterovaginal prolapse, unspecified: Secondary | ICD-10-CM | POA: Diagnosis not present

## 2013-06-19 DIAGNOSIS — K529 Noninfective gastroenteritis and colitis, unspecified: Secondary | ICD-10-CM | POA: Diagnosis not present

## 2013-06-19 DIAGNOSIS — R079 Chest pain, unspecified: Secondary | ICD-10-CM | POA: Diagnosis not present

## 2013-06-20 DIAGNOSIS — K219 Gastro-esophageal reflux disease without esophagitis: Secondary | ICD-10-CM | POA: Diagnosis not present

## 2013-06-22 DIAGNOSIS — K219 Gastro-esophageal reflux disease without esophagitis: Secondary | ICD-10-CM | POA: Diagnosis not present

## 2013-06-22 DIAGNOSIS — M1990 Unspecified osteoarthritis, unspecified site: Secondary | ICD-10-CM | POA: Diagnosis not present

## 2013-06-27 DIAGNOSIS — N760 Acute vaginitis: Secondary | ICD-10-CM | POA: Diagnosis not present

## 2013-06-27 DIAGNOSIS — N814 Uterovaginal prolapse, unspecified: Secondary | ICD-10-CM | POA: Diagnosis not present

## 2013-08-09 DIAGNOSIS — G459 Transient cerebral ischemic attack, unspecified: Secondary | ICD-10-CM | POA: Diagnosis not present

## 2013-08-09 DIAGNOSIS — F411 Generalized anxiety disorder: Secondary | ICD-10-CM | POA: Diagnosis not present

## 2013-08-09 DIAGNOSIS — E785 Hyperlipidemia, unspecified: Secondary | ICD-10-CM | POA: Diagnosis not present

## 2013-08-09 DIAGNOSIS — Z79899 Other long term (current) drug therapy: Secondary | ICD-10-CM | POA: Diagnosis not present

## 2013-08-17 DIAGNOSIS — F411 Generalized anxiety disorder: Secondary | ICD-10-CM | POA: Diagnosis not present

## 2013-08-17 DIAGNOSIS — G459 Transient cerebral ischemic attack, unspecified: Secondary | ICD-10-CM | POA: Diagnosis not present

## 2013-08-29 DIAGNOSIS — N939 Abnormal uterine and vaginal bleeding, unspecified: Secondary | ICD-10-CM | POA: Diagnosis not present

## 2013-08-29 DIAGNOSIS — N926 Irregular menstruation, unspecified: Secondary | ICD-10-CM | POA: Diagnosis not present

## 2014-01-30 DIAGNOSIS — Z23 Encounter for immunization: Secondary | ICD-10-CM | POA: Diagnosis not present

## 2014-02-19 DIAGNOSIS — Z8673 Personal history of transient ischemic attack (TIA), and cerebral infarction without residual deficits: Secondary | ICD-10-CM | POA: Diagnosis not present

## 2014-02-19 DIAGNOSIS — F419 Anxiety disorder, unspecified: Secondary | ICD-10-CM | POA: Diagnosis not present

## 2014-02-28 DIAGNOSIS — N812 Incomplete uterovaginal prolapse: Secondary | ICD-10-CM | POA: Diagnosis not present

## 2014-08-15 DIAGNOSIS — G459 Transient cerebral ischemic attack, unspecified: Secondary | ICD-10-CM | POA: Diagnosis not present

## 2014-08-15 DIAGNOSIS — E785 Hyperlipidemia, unspecified: Secondary | ICD-10-CM | POA: Diagnosis not present

## 2014-08-15 DIAGNOSIS — Z79899 Other long term (current) drug therapy: Secondary | ICD-10-CM | POA: Diagnosis not present

## 2014-08-21 DIAGNOSIS — F419 Anxiety disorder, unspecified: Secondary | ICD-10-CM | POA: Diagnosis not present

## 2014-08-21 DIAGNOSIS — Z8673 Personal history of transient ischemic attack (TIA), and cerebral infarction without residual deficits: Secondary | ICD-10-CM | POA: Diagnosis not present

## 2014-09-11 DIAGNOSIS — N814 Uterovaginal prolapse, unspecified: Secondary | ICD-10-CM | POA: Diagnosis not present

## 2014-09-23 DIAGNOSIS — S2232XA Fracture of one rib, left side, initial encounter for closed fracture: Secondary | ICD-10-CM | POA: Diagnosis not present

## 2014-09-23 DIAGNOSIS — K449 Diaphragmatic hernia without obstruction or gangrene: Secondary | ICD-10-CM | POA: Diagnosis not present

## 2014-09-23 DIAGNOSIS — W1800XA Striking against unspecified object with subsequent fall, initial encounter: Secondary | ICD-10-CM | POA: Diagnosis not present

## 2014-09-23 DIAGNOSIS — M549 Dorsalgia, unspecified: Secondary | ICD-10-CM | POA: Diagnosis not present

## 2014-09-23 DIAGNOSIS — Z8673 Personal history of transient ischemic attack (TIA), and cerebral infarction without residual deficits: Secondary | ICD-10-CM | POA: Diagnosis not present

## 2014-09-23 DIAGNOSIS — R0789 Other chest pain: Secondary | ICD-10-CM | POA: Diagnosis not present

## 2014-12-20 DIAGNOSIS — M79622 Pain in left upper arm: Secondary | ICD-10-CM | POA: Diagnosis not present

## 2014-12-20 DIAGNOSIS — Z8719 Personal history of other diseases of the digestive system: Secondary | ICD-10-CM | POA: Diagnosis not present

## 2014-12-20 DIAGNOSIS — Z6823 Body mass index (BMI) 23.0-23.9, adult: Secondary | ICD-10-CM | POA: Diagnosis not present

## 2015-01-31 DIAGNOSIS — Z23 Encounter for immunization: Secondary | ICD-10-CM | POA: Diagnosis not present

## 2015-02-14 ENCOUNTER — Other Ambulatory Visit (HOSPITAL_COMMUNITY): Payer: Self-pay | Admitting: Orthopaedic Surgery

## 2015-02-14 DIAGNOSIS — M25512 Pain in left shoulder: Secondary | ICD-10-CM

## 2015-02-14 DIAGNOSIS — Z8719 Personal history of other diseases of the digestive system: Secondary | ICD-10-CM | POA: Diagnosis not present

## 2015-02-14 DIAGNOSIS — M79622 Pain in left upper arm: Secondary | ICD-10-CM | POA: Diagnosis not present

## 2015-02-14 DIAGNOSIS — Z6823 Body mass index (BMI) 23.0-23.9, adult: Secondary | ICD-10-CM | POA: Diagnosis not present

## 2015-02-14 DIAGNOSIS — M25561 Pain in right knee: Secondary | ICD-10-CM | POA: Diagnosis not present

## 2015-02-28 ENCOUNTER — Other Ambulatory Visit (HOSPITAL_COMMUNITY): Payer: Self-pay | Admitting: Internal Medicine

## 2015-02-28 DIAGNOSIS — Z8673 Personal history of transient ischemic attack (TIA), and cerebral infarction without residual deficits: Secondary | ICD-10-CM | POA: Diagnosis not present

## 2015-02-28 DIAGNOSIS — F419 Anxiety disorder, unspecified: Secondary | ICD-10-CM | POA: Diagnosis not present

## 2015-02-28 DIAGNOSIS — Z6823 Body mass index (BMI) 23.0-23.9, adult: Secondary | ICD-10-CM | POA: Diagnosis not present

## 2015-02-28 DIAGNOSIS — Z1231 Encounter for screening mammogram for malignant neoplasm of breast: Secondary | ICD-10-CM

## 2015-03-07 ENCOUNTER — Ambulatory Visit (HOSPITAL_COMMUNITY)
Admission: RE | Admit: 2015-03-07 | Discharge: 2015-03-07 | Disposition: A | Discharge status: Home / Self Care | Payer: Medicare Other | Source: Ambulatory Visit | Attending: Orthopaedic Surgery | Admitting: Orthopaedic Surgery

## 2015-03-07 DIAGNOSIS — M7552 Bursitis of left shoulder: Secondary | ICD-10-CM | POA: Insufficient documentation

## 2015-03-07 DIAGNOSIS — M75122 Complete rotator cuff tear or rupture of left shoulder, not specified as traumatic: Secondary | ICD-10-CM | POA: Diagnosis not present

## 2015-03-07 DIAGNOSIS — M19012 Primary osteoarthritis, left shoulder: Secondary | ICD-10-CM | POA: Diagnosis not present

## 2015-03-07 DIAGNOSIS — M25512 Pain in left shoulder: Secondary | ICD-10-CM | POA: Diagnosis not present

## 2015-03-11 DIAGNOSIS — M25512 Pain in left shoulder: Secondary | ICD-10-CM | POA: Diagnosis not present

## 2015-03-11 DIAGNOSIS — Z6823 Body mass index (BMI) 23.0-23.9, adult: Secondary | ICD-10-CM | POA: Diagnosis not present

## 2015-03-11 DIAGNOSIS — Z8719 Personal history of other diseases of the digestive system: Secondary | ICD-10-CM | POA: Diagnosis not present

## 2015-03-11 DIAGNOSIS — M79622 Pain in left upper arm: Secondary | ICD-10-CM | POA: Diagnosis not present

## 2015-03-12 ENCOUNTER — Encounter (HOSPITAL_COMMUNITY): Payer: Self-pay

## 2015-03-12 ENCOUNTER — Ambulatory Visit (HOSPITAL_COMMUNITY): Payer: Medicare Other | Attending: Orthopaedic Surgery

## 2015-03-12 DIAGNOSIS — M25512 Pain in left shoulder: Secondary | ICD-10-CM

## 2015-03-12 DIAGNOSIS — M25612 Stiffness of left shoulder, not elsewhere classified: Secondary | ICD-10-CM

## 2015-03-12 DIAGNOSIS — M629 Disorder of muscle, unspecified: Secondary | ICD-10-CM | POA: Diagnosis not present

## 2015-03-12 DIAGNOSIS — M6289 Other specified disorders of muscle: Secondary | ICD-10-CM

## 2015-03-12 DIAGNOSIS — R29898 Other symptoms and signs involving the musculoskeletal system: Secondary | ICD-10-CM

## 2015-03-12 NOTE — Therapy (Signed)
[facility] [address] [place], [place], [address] Phone: [phone]   Fax:  [phone]  Occupational Therapy Evaluation  Patient Details  Name: Rebecca Cordova MRN: [id number] Date of Birth: 06-26-1932 Referring Provider: Sanjuana Cordova  Encounter Date: 03/12/2015      OT End of Session - 03/12/15 1221    Visit Number 1   Number of Visits 16   Date for OT Re-Evaluation 05/11/15  mini reassess: 04/09/15   Authorization Type Medicare part A   Authorization Time Period before 10th visit   Authorization - Visit Number 1   Authorization - Number of Visits 10   OT Start Time 1105   OT Stop Time 1148   OT Time Calculation (min) 43 min   Activity Tolerance Patient tolerated treatment well   Behavior During Therapy [name] for tasks assessed/performed      History reviewed. No pertinent past medical history.  Past Surgical History  Procedure Laterality Date  . Tonsillectomy    . Appendectomy    . Ovarian cyst removal    . Cholecystectomy    . Eye surgery      There were no vitals filed for this visit.  Visit Diagnosis:  Pain in joint of left shoulder - Plan: Ot plan of care cert/re-cert  Shoulder weakness - Plan: Ot plan of care cert/re-cert  Tight fascia - Plan: Ot plan of care cert/re-cert  Shoulder stiffness, left - Plan: Ot plan of care cert/re-cert      Subjective Assessment - 03/12/15 1108    Subjective  S: I am always on the go. I'm not sure if I did something to it when I was gardening.    Pertinent History Patient is a 81 y/o female S/P left shoulder pain which began approx. 6 weeks ago. Pt believes it may have started after a day of heavy gardening tasks. An X-ray was completed on 03/07/15 which showed a complete supraspinatus tendon tear, moderate to moderately severe acromioclavicular osteoarthritis, Subacromial/subdeltoid fluid compatible with bursitis. Dr. Luna Cordova has referred patient to occupational therapy for  evaluation and treatment.    Special Tests FOTO score: 40/100   Currently in Pain? Yes   Pain Score 6    Pain Location Shoulder   Pain Orientation Left   Pain Type Acute pain           [facility] OT Assessment - 03/12/15 1105    Assessment   Diagnosis left shoulder pain   Referring Provider Rebecca Cordova   Onset Date --  6 weeks ago   Prior Therapy None   Precautions   Precautions None   Restrictions   Weight Bearing Restrictions No   Balance Screen   Has the patient fallen in the past 6 months Yes   How many times? 1   Has the patient had a decrease in activity level because of a fear of falling?  No   Is the patient reluctant to leave their home because of a fear of falling?  No   Home  Environment   Family/patient expects to be discharged to: Private residence   Lives With Spouse   Prior Function   Level of [place] Retired   Leisure Very busy with household chores and yard work.   ADL   ADL comments Difficulty reaching out to the side especially when completing external rotation. Difficulty reaching back to pull pants over hips (internal rotation).   Mobility   Mobility Status History  of falls   Written Expression   Dominant Hand Right   Vision - History   Baseline Vision No visual deficits   Cognition   Overall Cognitive Status Within Functional Limits for tasks assessed   ROM / Strength   AROM / PROM / Strength AROM;Strength   Palpation   Palpation comment Max fascial restrictions in left upper arm, trapezius, and scapularis region.   AROM   Overall AROM Comments Assessed seated. er/IR adducted.   AROM Assessment Site Shoulder   Right/Left Shoulder Left   Left Shoulder Flexion 122 Degrees   Left Shoulder ABduction 94 Degrees   Left Shoulder Internal Rotation 90 Degrees   Left Shoulder External Rotation 65 Degrees   Strength   Overall Strength Comments Assessed seated. er/IR adducted.   Strength Assessment Site Shoulder    Right/Left Shoulder Left   Left Shoulder Flexion 3+/5   Left Shoulder ABduction 3-/5   Left Shoulder Internal Rotation 3/5   Left Shoulder External Rotation 4+/5                         OT Education - 03/12/15 1219    Education provided Yes   Education Details Shoulder stretches   Person(s) Educated Patient   Methods Explanation;Demonstration;Handout;Verbal cues;Tactile cues   Comprehension Returned demonstration;Verbalized understanding          OT Short Term Goals - 03/12/15 1225    OT SHORT TERM GOAL #1   Title Patient will be educated and independent with HEP to increase functional performance when using LUE.   Time 3   Period Weeks   Status New   OT SHORT TERM GOAL #2   Title Patient will increase P/ROM to WNL to increase ability to reach behind back to pull up pants.    Time 3   Period Weeks   Status New   OT SHORT TERM GOAL #3   Title Patient will increase LUE strength to 4/5 to increase ability to return to light household chores.    Time 3   Period Weeks   Status New   OT SHORT TERM GOAL #4   Title Patient will decrease pain level to 4/10 when completing movements that require her to reach out to the side.    Time 3   Period Weeks   Status New   OT SHORT TERM GOAL #5   Title Patient will decrease fascial restrictions from a max amount to a mod amount.   Time 3   Period Weeks   Status New           OT Long Term Goals - 03/12/15 1228    OT LONG TERM GOAL #1   Title Patient will return to her highest level of independence with all daily and househould activities using her LUE.   Time 6   Period Weeks   Status New   OT LONG TERM GOAL #2   Title Patient will increase LUE strength to 4+/5 to increase ability to return to normal daily and household tasks.   Time 6   Period Weeks   Status New   OT LONG TERM GOAL #3   Title Patient will increase A/ROM to [facility] to increase ability to complete dressing tasks with less difficulty.    Time 6    Period Weeks   Status New   OT LONG TERM GOAL #4   Title Patient will decrease pain level to 2/10 or less when using LUE during  daily tasks.    Time 6   Period Weeks   Status New   OT LONG TERM GOAL #5   Title Patient will decrease fascial restrictions to a min amount to increase functional mobility of LUE.   Time 6   Period Weeks   Status New               Plan - 04/01/15 1222    Clinical Impression Statement A: Patient is a 81 y/o female S/P left shoulder pain causing increased fascial restrictions and pain and decreased strength and ROM resulting in difficulty completing daily tasks using LUE.    Pt will benefit from skilled therapeutic intervention in order to improve on the following deficits (Retired) Decreased strength;Pain;Increased fascial restricitons;Decreased range of motion   Rehab Potential Excellent   Clinical Impairments Affecting Rehab Potential Patient a history of back pain and falls.   OT Frequency 2x / week   OT Duration 6 weeks   OT Treatment/Interventions Self-care/ADL training;Cryotherapy;Moist Heat;Electrical Stimulation;Therapeutic exercise;Therapeutic activities;Manual Therapy;Passive range of motion;Patient/family education;Ultrasound   Plan P: patient will benefit from skilled OT services to increase functional performance during daily tasks using LUE. Treatment Plan: myofascial release, passive stretching, A/ROM, shoulder strengthening and scapular strengthening.    Consulted and Agree with Plan of Care Patient          G-Codes - 2015/04/01 1230-08-10    Functional Assessment Tool Used FOTO score: 40/100 (60% impaired)   Functional Limitation Carrying, moving and handling objects   Carrying, Moving and Handling Objects Current Status [id number]) At least 60 percent but less than 80 percent impaired, limited or restricted   Carrying, Moving and Handling Objects Goal Status [id number]) At least 20 percent but less than 40 percent impaired, limited or restricted       Problem List There are no active problems to display for this patient.   Ailene Ravel, OTR/L,CBIS  [phone]  04-01-15, 12:35 PM  [facility] [address] [place], [place], [address] Phone: [phone]   Fax:  [phone]  Name: Rebecca Cordova MRN: [id number] Date of Birth: 1933/02/19

## 2015-03-12 NOTE — Patient Instructions (Signed)
Completed these stretches 2-3 times a day.  Flexibility: Corner Stretch   Standing in corner with hands just above shoulder level and feet ____ inches from corner, lean forward until a comfortable stretch is felt across chest. Hold _10___ seconds. Repeat _3___ times per set. Do ____ sets per session. Do ____ sessions per day.  http://orth.exer.us/342   Copyright  VHI. All rights reserved.   Scapular Retraction (Standing)   With arms at sides, pinch shoulder blades together. Repeat _10___ times per set. Do ____ sets per session. Do ____ sessions per day.    Posterior Capsule Stretch   Stand or sit, one arm across body so hand rests over opposite shoulder. Gently push on crossed elbow with other hand until stretch is felt in shoulder of crossed arm. Hold _10__ seconds.  Repeat _3__ times per session. Do ___ sessions per day.  Copyright  VHI. All rights reserved.   Internal Rotation Across Back  Grab the end of a towel with your affected side, palm facing backwards. Grab the towel with your unaffected side and pull your affected hand across your back until you feel a stretch in the front of your shoulder. If you feel pain, pull just to the pain, do not pull through the pain. Hold. Return your affected arm to your side. Try to keep your hand/arm close to your body during the entire movement.  Hold 10 seconds. Complete 3 times.  Flexors Stretch, Standing   Stand near wall and slide arm up, with palm facing away from wall, by leaning toward wall. Hold _10__ seconds.  Repeat _3__ times per session. Do ___ sessions per day.  Copyright  VHI. All rights reserved.

## 2015-03-18 ENCOUNTER — Ambulatory Visit (HOSPITAL_COMMUNITY)
Admission: RE | Admit: 2015-03-18 | Discharge: 2015-03-18 | Disposition: A | Discharge status: Home / Self Care | Payer: Medicare Other | Source: Ambulatory Visit | Attending: Internal Medicine | Admitting: Internal Medicine

## 2015-03-18 DIAGNOSIS — Z1231 Encounter for screening mammogram for malignant neoplasm of breast: Secondary | ICD-10-CM | POA: Diagnosis not present

## 2015-03-20 ENCOUNTER — Ambulatory Visit (HOSPITAL_COMMUNITY): Payer: Medicare Other | Admitting: Specialist

## 2015-03-20 DIAGNOSIS — R29898 Other symptoms and signs involving the musculoskeletal system: Secondary | ICD-10-CM | POA: Diagnosis not present

## 2015-03-20 DIAGNOSIS — M629 Disorder of muscle, unspecified: Secondary | ICD-10-CM | POA: Diagnosis not present

## 2015-03-20 DIAGNOSIS — M6289 Other specified disorders of muscle: Secondary | ICD-10-CM

## 2015-03-20 DIAGNOSIS — M25512 Pain in left shoulder: Secondary | ICD-10-CM | POA: Diagnosis not present

## 2015-03-20 DIAGNOSIS — M25612 Stiffness of left shoulder, not elsewhere classified: Secondary | ICD-10-CM

## 2015-03-20 NOTE — Therapy (Signed)
[place], [place], [address] Phone: [phone]   Fax:  [phone]  Occupational Therapy Treatment  Patient Details  Name: Rebecca Cordova MRN: [id number] Date of Birth: [birth year] Referring Provider: Sanjuana Kava  Encounter Date: 03/20/2015      OT End of Session - 03/20/15 1417    Visit Number 2   Number of Visits 16   Date for OT Re-Evaluation 05/11/15  mini reassess on 12/20   Authorization Type Medicare part A   Authorization Time Period before 10th visit   Authorization - Visit Number 2   Authorization - Number of Visits 10   OT Start Time 1350   OT Stop Time 1430   OT Time Calculation (min) 40 min   Activity Tolerance Patient tolerated treatment well   Behavior During Therapy [facility] for tasks assessed/performed      No past medical history on file.  Past Surgical History  Procedure Laterality Date  . Tonsillectomy    . Appendectomy    . Ovarian cyst removal    . Cholecystectomy    . Eye surgery      There were no vitals filed for this visit.  Visit Diagnosis:  Pain in joint of left shoulder  Shoulder weakness  Tight fascia  Shoulder stiffness, left      Subjective Assessment - 03/20/15 1353    Subjective  S:  It was feeling better and then I used it too much and it is a little sore.  Reaching behind me to put my coat on and if i bear weight through it.    Currently in Pain? Yes   Pain Score 4    Pain Location Shoulder   Pain Orientation Left   Pain Descriptors / Indicators Aching   Pain Type Acute pain            [facility] OT Assessment - 03/20/15 0001    Assessment   Diagnosis left shoulder pain   Precautions   Precautions None                  OT Treatments/Exercises (OP) - 03/20/15 0001    Exercises   Exercises Shoulder   Shoulder Exercises: Supine   Protraction PROM;5 reps;AAROM;10 reps   Horizontal ABduction PROM;5 reps;AAROM;10 reps   External Rotation  PROM;5 reps;AAROM;10 reps   Internal Rotation PROM;5 reps;AAROM;10 reps   Flexion PROM;5 reps;AAROM;10 reps   ABduction PROM;5 reps;AAROM;10 reps   Other Supine Exercises serratus anterior punch 10 times    Shoulder Exercises: Seated   Protraction AAROM;10 reps   Horizontal ABduction AAROM;10 reps   External Rotation AAROM;10 reps   Internal Rotation AAROM;10 reps   Flexion AAROM;10 reps   Abduction AAROM;10 reps   Shoulder Exercises: Therapy Ball   Flexion 10 reps   ABduction 10 reps   Right/Left 5 reps   Manual Therapy   Manual Therapy Myofascial release   Manual therapy comments manual therapy completed prior to other interventions completed this date.     Myofascial Release MFR and manual stretching to left upper arm, scapular region, shoulder region and associate areas to decrease pain and restrictions that are limiting full range needed to resume full activities                 OT Education - 03/20/15 1417    Education provided Yes   Education Details issued treatment plan and reviewed goals with patient    Person(s) Educated Patient  Methods Explanation;Handout   Comprehension Verbalized understanding          OT Short Term Goals - 03/20/15 1422    OT SHORT TERM GOAL #1   Title Patient will be educated and independent with HEP to increase functional performance when using LUE.   Time 3   Period Weeks   Status On-going   OT SHORT TERM GOAL #2   Title Patient will increase P/ROM to WNL to increase ability to reach behind back to pull up pants.    Time 3   Period Weeks   Status On-going   OT SHORT TERM GOAL #3   Title Patient will increase LUE strength to 4/5 to increase ability to return to light household chores.    Time 3   Period Weeks   Status On-going   OT SHORT TERM GOAL #4   Title Patient will decrease pain level to 4/10 when completing movements that require her to reach out to the side.    Time 3   Period Weeks   Status On-going   OT SHORT  TERM GOAL #5   Title Patient will decrease fascial restrictions from a max amount to a mod amount.   Time 3   Period Weeks   Status On-going           OT Long Term Goals - 03/20/15 1422    OT LONG TERM GOAL #1   Title Patient will return to her highest level of independence with all daily and househould activities using her LUE.   Time 6   Period Weeks   Status On-going   OT LONG TERM GOAL #2   Title Patient will increase LUE strength to 4+/5 to increase ability to return to normal daily and household tasks.   Time 6   Period Weeks   Status On-going   OT LONG TERM GOAL #3   Title Patient will increase A/ROM to [facility] to increase ability to complete dressing tasks with less difficulty.    Time 6   Period Weeks   Status On-going   OT LONG TERM GOAL #4   Title Patient will decrease pain level to 2/10 or less when using LUE during daily tasks.    Time 6   Period Weeks   Status On-going   OT LONG TERM GOAL #5   Title Patient will decrease fascial restrictions to a min amount to increase functional mobility of LUE.   Time 6   Period Weeks   Status On-going               Plan - 03/20/15 1418    Clinical Impression Statement A:  Began manual therapy and therapeutic exercises in supine and seated this date focusing on achieving end range AA/ROM and A/ROM.     Plan P:  patient will increase to full P/ROM and AA/ROM in order to improve ease and independence with ADLs such as dressing and fixing her hair.         Problem List There are no active problems to display for this patient.   Vangie Bicker, OTR/L [phone]  03/20/2015, 2:23 PM  [place], [place], [address] Phone: [phone]   Fax:  [phone]  Name: Rebecca Cordova MRN: [id number] Date of Birth: [birth year]

## 2015-03-22 ENCOUNTER — Encounter (HOSPITAL_COMMUNITY): Payer: Self-pay | Admitting: Occupational Therapy

## 2015-03-22 ENCOUNTER — Ambulatory Visit (HOSPITAL_COMMUNITY): Payer: Medicare Other | Attending: Orthopaedic Surgery | Admitting: Occupational Therapy

## 2015-03-22 DIAGNOSIS — M25612 Stiffness of left shoulder, not elsewhere classified: Secondary | ICD-10-CM | POA: Insufficient documentation

## 2015-03-22 DIAGNOSIS — M629 Disorder of muscle, unspecified: Secondary | ICD-10-CM | POA: Diagnosis not present

## 2015-03-22 DIAGNOSIS — M25512 Pain in left shoulder: Secondary | ICD-10-CM | POA: Diagnosis not present

## 2015-03-22 DIAGNOSIS — R29898 Other symptoms and signs involving the musculoskeletal system: Secondary | ICD-10-CM | POA: Insufficient documentation

## 2015-03-22 DIAGNOSIS — M6289 Other specified disorders of muscle: Secondary | ICD-10-CM

## 2015-03-22 NOTE — Therapy (Signed)
[place], [place], [address] Phone: [phone]   Fax:  [phone]  Occupational Therapy Treatment  Patient Details  Name: Rebecca Cordova MRN: [id number] Date of Birth: 03/14/1933 Referring Provider: Sanjuana Kava  Encounter Date: 03/22/2015      OT End of Session - 03/22/15 1340    Visit Number 2   Number of Visits 16   Date for OT Re-Evaluation 05/11/15  mini reassess on 12/20   Authorization Type Medicare part A   Authorization Time Period before 10th visit   Authorization - Visit Number 2   Authorization - Number of Visits 10   OT Start Time 1257   OT Stop Time 1342   OT Time Calculation (min) 45 min   Activity Tolerance Patient tolerated treatment well   Behavior During Therapy [facility] for tasks assessed/performed      History reviewed. No pertinent past medical history.  Past Surgical History  Procedure Laterality Date  . Tonsillectomy    . Appendectomy    . Ovarian cyst removal    . Cholecystectomy    . Eye surgery      There were no vitals filed for this visit.  Visit Diagnosis:  Pain in joint of left shoulder  Shoulder weakness  Tight fascia  Shoulder stiffness, left      Subjective Assessment - 03/22/15 1255    Subjective  S: It doesn't bother me except when I move it.    Currently in Pain? Yes   Pain Score 6    Pain Location Shoulder   Pain Orientation Left   Pain Descriptors / Indicators Aching   Pain Type Acute pain            [facility] OT Assessment - 03/22/15 1255    Assessment   Diagnosis left shoulder pain   Precautions   Precautions None                  OT Treatments/Exercises (OP) - 03/22/15 1300    Exercises   Exercises Shoulder   Shoulder Exercises: Supine   Protraction PROM;5 reps;AAROM;12 reps   Horizontal ABduction PROM;5 reps;AAROM;12 reps   External Rotation PROM;5 reps;AAROM;12 reps   Internal Rotation PROM;5 reps;AAROM;12 reps   Flexion  PROM;5 reps;AAROM;12 reps   ABduction PROM;5 reps;AAROM;12 reps   Other Supine Exercises serratus anterior punch 12 times    Shoulder Exercises: Seated   Elevation AROM;10 reps   Extension AROM;10 reps   Row AROM;10 reps   Protraction AAROM;12 reps   Horizontal ABduction AAROM;12 reps   External Rotation AAROM;12 reps   Internal Rotation AAROM;12 reps   Flexion AAROM;12 reps   Abduction AAROM;12 reps   Shoulder Exercises: ROM/Strengthening   Wall Wash 1'   Thumb Tacks 1'   Proximal Shoulder Strengthening, Supine 10X each no rest breaks   Manual Therapy   Manual Therapy Myofascial release   Manual therapy comments manual therapy completed prior to other interventions completed this date.     Myofascial Release MFR and manual stretching to left upper arm, scapular region, shoulder region and associate areas to decrease pain and restrictions that are limiting full range needed to resume full activities                 OT Education - 03/22/15 1333    Education provided Yes   Education Details AA/ROM exercises    Person(s) Educated Patient   Methods Explanation;Demonstration;Handout   Comprehension Verbalized understanding;Returned demonstration  OT Short Term Goals - 03/20/15 1422    OT SHORT TERM GOAL #1   Title Patient will be educated and independent with HEP to increase functional performance when using LUE.   Time 3   Period Weeks   Status On-going   OT SHORT TERM GOAL #2   Title Patient will increase P/ROM to WNL to increase ability to reach behind back to pull up pants.    Time 3   Period Weeks   Status On-going   OT SHORT TERM GOAL #3   Title Patient will increase LUE strength to 4/5 to increase ability to return to light household chores.    Time 3   Period Weeks   Status On-going   OT SHORT TERM GOAL #4   Title Patient will decrease pain level to 4/10 when completing movements that require her to reach out to the side.    Time 3   Period  Weeks   Status On-going   OT SHORT TERM GOAL #5   Title Patient will decrease fascial restrictions from a max amount to a mod amount.   Time 3   Period Weeks   Status On-going           OT Long Term Goals - 03/20/15 1422    OT LONG TERM GOAL #1   Title Patient will return to her highest level of independence with all daily and househould activities using her LUE.   Time 6   Period Weeks   Status On-going   OT LONG TERM GOAL #2   Title Patient will increase LUE strength to 4+/5 to increase ability to return to normal daily and household tasks.   Time 6   Period Weeks   Status On-going   OT LONG TERM GOAL #3   Title Patient will increase A/ROM to [facility] to increase ability to complete dressing tasks with less difficulty.    Time 6   Period Weeks   Status On-going   OT LONG TERM GOAL #4   Title Patient will decrease pain level to 2/10 or less when using LUE during daily tasks.    Time 6   Period Weeks   Status On-going   OT LONG TERM GOAL #5   Title Patient will decrease fascial restrictions to a min amount to increase functional mobility of LUE.   Time 6   Period Weeks   Status On-going               Plan - 03/22/15 1343    Clinical Impression Statement A: Increased AA/ROM to 12 repetitions in supine and sitting, pt requires verbal cuing for form. Added scapular A/ROM exercises for increased scapular stability, and added proximal shoulder strenghtening in supine. Pt reports pain when using her arm during the day, however no pain is present at rest.    Plan P: Follow up on AA/ROM HEP. Add scapular theraband for increased scapular stability during functional activities.         Problem List There are no active problems to display for this patient.   Guadelupe Sabin, OTR/L  [phone]  03/22/2015, 2:13 PM  [facility] [address] [place], [place], [address] Phone: [phone]   Fax:  [phone]  Name: OLANDA BOSSI MRN: [id number] Date of Birth: [birth year]

## 2015-03-22 NOTE — Patient Instructions (Signed)

## 2015-03-26 ENCOUNTER — Encounter (HOSPITAL_COMMUNITY): Payer: Self-pay | Admitting: Occupational Therapy

## 2015-03-26 ENCOUNTER — Ambulatory Visit (HOSPITAL_COMMUNITY): Payer: Medicare Other | Admitting: Occupational Therapy

## 2015-03-26 DIAGNOSIS — M25612 Stiffness of left shoulder, not elsewhere classified: Secondary | ICD-10-CM | POA: Diagnosis not present

## 2015-03-26 DIAGNOSIS — R29898 Other symptoms and signs involving the musculoskeletal system: Secondary | ICD-10-CM

## 2015-03-26 DIAGNOSIS — M6289 Other specified disorders of muscle: Secondary | ICD-10-CM

## 2015-03-26 DIAGNOSIS — M629 Disorder of muscle, unspecified: Secondary | ICD-10-CM

## 2015-03-26 DIAGNOSIS — M25512 Pain in left shoulder: Secondary | ICD-10-CM | POA: Diagnosis not present

## 2015-03-26 NOTE — Therapy (Signed)
[place], [place], [address] Phone: [phone]   Fax:  [phone]  Occupational Therapy Treatment  Patient Details  Name: EDWARD MINDER MRN: [id number] Date of Birth: Oct 26, 1932 Referring Provider: Sanjuana Kava  Encounter Date: 03/26/2015      OT End of Session - 03/26/15 1346    Visit Number 4   Number of Visits 16   Date for OT Re-Evaluation 05/11/15  mini reassess on 12/20   Authorization Type Medicare part A   Authorization Time Period before 10th visit   Authorization - Visit Number 4   Authorization - Number of Visits 10   OT Start Time 1300   OT Stop Time 1345   OT Time Calculation (min) 45 min   Activity Tolerance Patient tolerated treatment well   Behavior During Therapy [facility] for tasks assessed/performed      History reviewed. No pertinent past medical history.  Past Surgical History  Procedure Laterality Date  . Tonsillectomy    . Appendectomy    . Ovarian cyst removal    . Cholecystectomy    . Eye surgery      There were no vitals filed for this visit.  Visit Diagnosis:  Pain in joint of left shoulder  Shoulder weakness  Tight fascia  Shoulder stiffness, left      Subjective Assessment - 03/26/15 1259    Subjective  S: My arm felt better for a day or so and then it started hurting again.    Currently in Pain? Yes   Pain Score 3    Pain Location Shoulder   Pain Orientation Left   Pain Descriptors / Indicators Aching            [facility] OT Assessment - 03/26/15 1259    Assessment   Diagnosis left shoulder pain   Precautions   Precautions None                  OT Treatments/Exercises (OP) - 03/26/15 1303    Exercises   Exercises Shoulder   Shoulder Exercises: Supine   Protraction PROM;5 reps;AAROM;12 reps   Horizontal ABduction PROM;5 reps;AAROM;12 reps   External Rotation PROM;5 reps;AAROM;12 reps   Internal Rotation PROM;5 reps;AAROM;12 reps   Flexion PROM;5 reps;AAROM;12 reps   ABduction PROM;5 reps;AAROM;12 reps   Other Supine Exercises serratus anterior punch 12 times    Shoulder Exercises: Seated   Elevation AROM;15 reps   Extension AROM;15 reps   Row AROM;15 reps   Protraction AAROM;12 reps   Horizontal ABduction AAROM;12 reps   External Rotation AAROM;12 reps   Internal Rotation AAROM;12 reps   Flexion AAROM;12 reps   Abduction AAROM;12 reps   Shoulder Exercises: Standing   Extension Theraband;10 reps   Theraband Level (Shoulder Extension) Level 2 (Red)   Row Theraband;10 reps   Theraband Level (Shoulder Row) Level 2 (Red)   Retraction Theraband;10 reps   Theraband Level (Shoulder Retraction) Level 2 (Red)   Shoulder Exercises: ROM/Strengthening   Wall Wash 1'   Thumb Tacks 1'   Proximal Shoulder Strengthening, Supine 10X each no rest breaks   Manual Therapy   Manual Therapy Myofascial release   Manual therapy comments manual therapy completed prior to other interventions completed this date.     Myofascial Release MFR and manual stretching to left upper arm, scapular region, shoulder region and associate areas to decrease pain and restrictions that are limiting full range needed to resume full activities  OT Short Term Goals - 03/20/15 1422    OT SHORT TERM GOAL #1   Title Patient will be educated and independent with HEP to increase functional performance when using LUE.   Time 3   Period Weeks   Status On-going   OT SHORT TERM GOAL #2   Title Patient will increase P/ROM to WNL to increase ability to reach behind back to pull up pants.    Time 3   Period Weeks   Status On-going   OT SHORT TERM GOAL #3   Title Patient will increase LUE strength to 4/5 to increase ability to return to light household chores.    Time 3   Period Weeks   Status On-going   OT SHORT TERM GOAL #4   Title Patient will decrease pain level to 4/10 when completing movements that require her to reach  out to the side.    Time 3   Period Weeks   Status On-going   OT SHORT TERM GOAL #5   Title Patient will decrease fascial restrictions from a max amount to a mod amount.   Time 3   Period Weeks   Status On-going           OT Long Term Goals - 03/20/15 1422    OT LONG TERM GOAL #1   Title Patient will return to her highest level of independence with all daily and househould activities using her LUE.   Time 6   Period Weeks   Status On-going   OT LONG TERM GOAL #2   Title Patient will increase LUE strength to 4+/5 to increase ability to return to normal daily and household tasks.   Time 6   Period Weeks   Status On-going   OT LONG TERM GOAL #3   Title Patient will increase A/ROM to [facility] to increase ability to complete dressing tasks with less difficulty.    Time 6   Period Weeks   Status On-going   OT LONG TERM GOAL #4   Title Patient will decrease pain level to 2/10 or less when using LUE during daily tasks.    Time 6   Period Weeks   Status On-going   OT LONG TERM GOAL #5   Title Patient will decrease fascial restrictions to a min amount to increase functional mobility of LUE.   Time 6   Period Weeks   Status On-going               Plan - 03/26/15 1347    Clinical Impression Statement A: Added red scapular theraband this session, consistent verbal and tactile cuing to facilitate scapular mobility. Pt required verbal cuing for form during exercises, demonstration for abduction in supine. Pt reports minimal fatigue at end of session, no complaints of increased pain. Pt reports AA/ROM HEP is going well, she tries to complete it dailiy.    Plan P: Continue working on independence in completion of AA/ROM exercises and scapular theraband. Increase AA/ROM repetitions to 15 supine & standing.         Problem List There are no active problems to display for this patient.   Guadelupe Sabin, OTR/L  [phone]  03/26/2015, 1:53 PM  [facility] [address] [place], [place], [address] Phone: [phone]   Fax:  [phone]  Name: TITIANNA NOLDE MRN: [id number] Date of Birth: 1932/07/23

## 2015-03-28 ENCOUNTER — Ambulatory Visit (HOSPITAL_COMMUNITY): Payer: Medicare Other

## 2015-03-28 ENCOUNTER — Encounter (HOSPITAL_COMMUNITY): Payer: Self-pay

## 2015-03-28 DIAGNOSIS — M25512 Pain in left shoulder: Secondary | ICD-10-CM | POA: Diagnosis not present

## 2015-03-28 DIAGNOSIS — M6289 Other specified disorders of muscle: Secondary | ICD-10-CM

## 2015-03-28 DIAGNOSIS — R29898 Other symptoms and signs involving the musculoskeletal system: Secondary | ICD-10-CM | POA: Diagnosis not present

## 2015-03-28 DIAGNOSIS — M25612 Stiffness of left shoulder, not elsewhere classified: Secondary | ICD-10-CM | POA: Diagnosis not present

## 2015-03-28 DIAGNOSIS — M629 Disorder of muscle, unspecified: Secondary | ICD-10-CM

## 2015-03-28 NOTE — Therapy (Signed)
[place], [place], [address] Phone: [phone]   Fax:  [phone]  Occupational Therapy Treatment  Patient Details  Name: Rebecca Cordova MRN: [id number] Date of Birth: Oct 19, 1932 Referring Provider: Sanjuana Kava  Encounter Date: 03/28/2015      OT End of Session - 03/28/15 1127    Visit Number 5   Number of Visits 16   Date for OT Re-Evaluation 05/11/15  mini reassess on 12/20   Authorization Type Medicare part A   Authorization Time Period before 10th visit   Authorization - Visit Number 5   Authorization - Number of Visits 10   OT Start Time [id number]   OT Stop Time 1100   OT Time Calculation (min) 45 min   Activity Tolerance Patient tolerated treatment well   Behavior During Therapy [facility] for tasks assessed/performed      History reviewed. No pertinent past medical history.  Past Surgical History  Procedure Laterality Date  . Tonsillectomy    . Appendectomy    . Ovarian cyst removal    . Cholecystectomy    . Eye surgery      There were no vitals filed for this visit.  Visit Diagnosis:  Pain in joint of left shoulder  Shoulder weakness  Tight fascia                    OT Treatments/Exercises (OP) - 03/28/15 1052    Exercises   Exercises Shoulder   Shoulder Exercises: Supine   Protraction PROM;5 reps;AAROM;12 reps   Horizontal ABduction PROM;5 reps;AAROM;12 reps   External Rotation PROM;5 reps;AAROM;12 reps   Internal Rotation PROM;5 reps;AAROM;12 reps   Flexion PROM;5 reps;AAROM;12 reps   ABduction PROM;5 reps;AAROM;12 reps   Other Supine Exercises serratus anterior punch 12 times    Shoulder Exercises: Standing   Protraction AAROM;12 reps   Horizontal ABduction --   External Rotation AAROM;12 reps   Internal Rotation AAROM;12 reps   Flexion AAROM;12 reps   ABduction AAROM;12 reps   Shoulder Exercises: ROM/Strengthening   Proximal Shoulder Strengthening, Supine 10X  each no rest breaks   Manual Therapy   Manual Therapy Myofascial release   Manual therapy comments manual therapy completed prior to other interventions completed this date.     Myofascial Release Myofascial release and manual stretching to left upper arm, scapular region, shoulder region and associate areas to decrease pain and restrictions that are limiting full range needed to resume full activities                   OT Short Term Goals - 03/20/15 1422    OT SHORT TERM GOAL #1   Title Patient will be educated and independent with HEP to increase functional performance when using LUE.   Time 3   Period Weeks   Status On-going   OT SHORT TERM GOAL #2   Title Patient will increase P/ROM to WNL to increase ability to reach behind back to pull up pants.    Time 3   Period Weeks   Status On-going   OT SHORT TERM GOAL #3   Title Patient will increase LUE strength to 4/5 to increase ability to return to light household chores.    Time 3   Period Weeks   Status On-going   OT SHORT TERM GOAL #4   Title Patient will decrease pain level to 4/10 when completing movements that require her to reach out to the side.  Time 3   Period Weeks   Status On-going   OT SHORT TERM GOAL #5   Title Patient will decrease fascial restrictions from a max amount to a mod amount.   Time 3   Period Weeks   Status On-going           OT Long Term Goals - 03/20/15 1422    OT LONG TERM GOAL #1   Title Patient will return to her highest level of independence with all daily and househould activities using her LUE.   Time 6   Period Weeks   Status On-going   OT LONG TERM GOAL #2   Title Patient will increase LUE strength to 4+/5 to increase ability to return to normal daily and household tasks.   Time 6   Period Weeks   Status On-going   OT LONG TERM GOAL #3   Title Patient will increase A/ROM to [facility] to increase ability to complete dressing tasks with less difficulty.    Time 6   Period  Weeks   Status On-going   OT LONG TERM GOAL #4   Title Patient will decrease pain level to 2/10 or less when using LUE during daily tasks.    Time 6   Period Weeks   Status On-going   OT LONG TERM GOAL #5   Title Patient will decrease fascial restrictions to a min amount to increase functional mobility of LUE.   Time 6   Period Weeks   Status On-going               Plan - 03/28/15 1137    Clinical Impression Statement A: Pt continues to require max verbal and at times physical cues for proper form during AA/ROM exercises. patient also makes a lot of excuses for her difficulty to relax during passive stretches as she max difficulty.   Plan P: Continue work on independence and providing less cueing during exercises. Add wall wash.        Problem List There are no active problems to display for this patient.   Ailene Ravel, OTR/L,CBIS  [phone]  03/28/2015, 11:40 AM  [facility] [address] [place], [place], [address] Phone: [phone]   Fax:  [phone]  Name: Rebecca Cordova MRN: [id number] Date of Birth: 10-06-1932

## 2015-04-03 ENCOUNTER — Encounter (HOSPITAL_COMMUNITY): Payer: Self-pay

## 2015-04-03 ENCOUNTER — Ambulatory Visit (HOSPITAL_COMMUNITY): Payer: Medicare Other

## 2015-04-03 DIAGNOSIS — M25512 Pain in left shoulder: Secondary | ICD-10-CM | POA: Diagnosis not present

## 2015-04-03 DIAGNOSIS — M25612 Stiffness of left shoulder, not elsewhere classified: Secondary | ICD-10-CM

## 2015-04-03 DIAGNOSIS — M6289 Other specified disorders of muscle: Secondary | ICD-10-CM

## 2015-04-03 DIAGNOSIS — R29898 Other symptoms and signs involving the musculoskeletal system: Secondary | ICD-10-CM

## 2015-04-03 DIAGNOSIS — M629 Disorder of muscle, unspecified: Secondary | ICD-10-CM

## 2015-04-03 NOTE — Therapy (Signed)
[place], [place], [address] Phone: [phone]   Fax:  [phone]  Occupational Therapy Treatment  Patient Details  Name: Rebecca Cordova MRN: [id number] Date of Birth: 27-Feb-1933 Referring Provider: Sanjuana Kava  Encounter Date: 04/03/2015      OT End of Session - 04/03/15 1621    Visit Number 6   Number of Visits 16   Date for OT Re-Evaluation 05/11/15  mini reassess on 12/20   Authorization Type Medicare part A   Authorization Time Period before 10th visit   Authorization - Visit Number 6   Authorization - Number of Visits 10   OT Start Time 1300   OT Stop Time 1345   OT Time Calculation (min) 45 min   Activity Tolerance Patient tolerated treatment well   Behavior During Therapy [facility] for tasks assessed/performed      History reviewed. No pertinent past medical history.  Past Surgical History  Procedure Laterality Date  . Tonsillectomy    . Appendectomy    . Ovarian cyst removal    . Cholecystectomy    . Eye surgery      There were no vitals filed for this visit.  Visit Diagnosis:  Pain in joint of left shoulder  Shoulder weakness  Tight fascia  Shoulder stiffness, left      Subjective Assessment - 04/03/15 1620    Subjective  S: My arm feels that same but it's slowly getting better I think.   Currently in Pain? Yes   Pain Score 5    Pain Location Shoulder   Pain Orientation Left   Pain Descriptors / Indicators Aching   Pain Type Acute pain            [facility] OT Assessment - 04/03/15 1325    Assessment   Diagnosis left shoulder pain   Precautions   Precautions None                  OT Treatments/Exercises (OP) - 04/03/15 1325    Exercises   Exercises Shoulder   Shoulder Exercises: Supine   Protraction PROM;5 reps;AAROM;12 reps   Horizontal ABduction PROM;5 reps;AROM;12 reps   External Rotation PROM;5 reps;AROM;12 reps   Internal Rotation PROM;5 reps;AROM;12  reps   Flexion PROM;5 reps;AAROM;12 reps   ABduction PROM;5 reps;AROM;12 reps   Shoulder Exercises: Standing   Protraction AROM;12 reps   Horizontal ABduction AROM;12 reps   External Rotation AROM;12 reps   Internal Rotation AROM;12 reps   Flexion AROM;12 reps   ABduction AROM;12 reps   Extension Theraband;10 reps   Theraband Level (Shoulder Extension) Level 2 (Red)   Row Theraband;10 reps   Theraband Level (Shoulder Row) Level 2 (Red)   Retraction Theraband;10 reps   Theraband Level (Shoulder Retraction) Level 2 (Red)   Shoulder Exercises: ROM/Strengthening   UBE (Upper Arm Bike) Level 1 2' reverse 2' forward   X to V Arms 10X   Proximal Shoulder Strengthening, Supine 12X no rest breaks   Proximal Shoulder Strengthening, Seated 12X no rest breaks   Manual Therapy   Manual Therapy Myofascial release   Manual therapy comments manual therapy completed prior to other interventions completed this date.     Myofascial Release Myofascial release and manual stretching to left upper arm, scapular region, shoulder region and associate areas to decrease pain and restrictions that are limiting full range needed to resume full activities  OT Short Term Goals - 03/20/15 1422    OT SHORT TERM GOAL #1   Title Patient will be educated and independent with HEP to increase functional performance when using LUE.   Time 3   Period Weeks   Status On-going   OT SHORT TERM GOAL #2   Title Patient will increase P/ROM to WNL to increase ability to reach behind back to pull up pants.    Time 3   Period Weeks   Status On-going   OT SHORT TERM GOAL #3   Title Patient will increase LUE strength to 4/5 to increase ability to return to light household chores.    Time 3   Period Weeks   Status On-going   OT SHORT TERM GOAL #4   Title Patient will decrease pain level to 4/10 when completing movements that require her to reach out to the side.    Time 3   Period Weeks    Status On-going   OT SHORT TERM GOAL #5   Title Patient will decrease fascial restrictions from a max amount to a mod amount.   Time 3   Period Weeks   Status On-going           OT Long Term Goals - 03/20/15 1422    OT LONG TERM GOAL #1   Title Patient will return to her highest level of independence with all daily and househould activities using her LUE.   Time 6   Period Weeks   Status On-going   OT LONG TERM GOAL #2   Title Patient will increase LUE strength to 4+/5 to increase ability to return to normal daily and household tasks.   Time 6   Period Weeks   Status On-going   OT LONG TERM GOAL #3   Title Patient will increase A/ROM to [facility] to increase ability to complete dressing tasks with less difficulty.    Time 6   Period Weeks   Status On-going   OT LONG TERM GOAL #4   Title Patient will decrease pain level to 2/10 or less when using LUE during daily tasks.    Time 6   Period Weeks   Status On-going   OT LONG TERM GOAL #5   Title Patient will decrease fascial restrictions to a min amount to increase functional mobility of LUE.   Time 6   Period Weeks   Status On-going               Plan - 04/03/15 1621    Clinical Impression Statement A: Did not add wall wash due to time constraint. patient did slightly better and required less cueing for form and technique. Added UBE bike.    Plan P: Add wall wash.        Problem List There are no active problems to display for this patient.   Ailene Ravel, OTR/L,CBIS  [phone]  04/03/2015, 4:23 PM  [facility] [address] [place], [place], [address] Phone: [phone]   Fax:  [phone]  Name: Rebecca Cordova MRN: [id number] Date of Birth: 04/03/1933

## 2015-04-05 ENCOUNTER — Encounter (HOSPITAL_COMMUNITY): Payer: Medicare Other | Admitting: Occupational Therapy

## 2015-04-09 ENCOUNTER — Ambulatory Visit (HOSPITAL_COMMUNITY): Payer: Medicare Other | Admitting: Occupational Therapy

## 2015-04-09 ENCOUNTER — Encounter (HOSPITAL_COMMUNITY): Payer: Self-pay | Admitting: Occupational Therapy

## 2015-04-09 DIAGNOSIS — R29898 Other symptoms and signs involving the musculoskeletal system: Secondary | ICD-10-CM

## 2015-04-09 DIAGNOSIS — M25512 Pain in left shoulder: Secondary | ICD-10-CM | POA: Diagnosis not present

## 2015-04-09 DIAGNOSIS — M25612 Stiffness of left shoulder, not elsewhere classified: Secondary | ICD-10-CM | POA: Diagnosis not present

## 2015-04-09 DIAGNOSIS — M6289 Other specified disorders of muscle: Secondary | ICD-10-CM

## 2015-04-09 DIAGNOSIS — M629 Disorder of muscle, unspecified: Secondary | ICD-10-CM

## 2015-04-09 NOTE — Therapy (Signed)
[facility] [address] [place], [place], [address] Phone: [phone]   Fax:  [phone]  Occupational Therapy Reassessment and Treatment  Patient Details  Name: Rebecca Cordova MRN: [id number] Date of Birth: 1932-09-01 Referring Provider: Sanjuana Kava  Encounter Date: 04/09/2015      OT End of Session - 04/09/15 1642    Visit Number 7   Number of Visits 16   Date for OT Re-Evaluation 05/11/15   Authorization Type Medicare part A   Authorization Time Period before 10th visit   Authorization - Visit Number 7   Authorization - Number of Visits 10   OT Start Time 1304   OT Stop Time 1345   OT Time Calculation (min) 41 min   Activity Tolerance Patient tolerated treatment well   Behavior During Therapy [facility] for tasks assessed/performed      History reviewed. No pertinent past medical history.  Past Surgical History  Procedure Laterality Date  . Tonsillectomy    . Appendectomy    . Ovarian cyst removal    . Cholecystectomy    . Eye surgery      There were no vitals filed for this visit.  Visit Diagnosis:  Pain in joint of left shoulder  Shoulder weakness  Tight fascia  Shoulder stiffness, left      Subjective Assessment - 04/09/15 1306    Subjective  S: I fell the other day and my rib has been hurting.    Currently in Pain? Yes   Pain Score 4    Pain Location Shoulder   Pain Orientation Left   Pain Descriptors / Indicators Aching   Pain Type Acute pain           [facility] OT Assessment - 04/09/15 1305    Assessment   Diagnosis left shoulder pain   Precautions   Precautions None   Palpation   Palpation comment Mod fascial restrictions in left upper arm, trapezius, and scapularis region.   AROM   Overall AROM Comments Assessed seated. er/IR adducted.   AROM Assessment Site Shoulder   Right/Left Shoulder Left   Left Shoulder Flexion 146 Degrees  122 previous   Left Shoulder ABduction 143 Degrees  94  previous   Left Shoulder Internal Rotation 90 Degrees  same as previous   Left Shoulder External Rotation 88 Degrees  65 prevous   Strength   Overall Strength Comments Assessed seated. er/IR adducted.   Strength Assessment Site Shoulder   Right/Left Shoulder Left   Left Shoulder Flexion 4/5  3+/5 previous   Left Shoulder ABduction 4-/5  3-/5 previous   Left Shoulder Internal Rotation 4/5  3/5 previous   Left Shoulder External Rotation 4+/5  same as previous                  OT Treatments/Exercises (OP) - 04/09/15 1307    Exercises   Exercises Shoulder   Shoulder Exercises: Supine   Protraction PROM;5 reps;AAROM;12 reps   Horizontal ABduction PROM;5 reps;AROM;12 reps   External Rotation PROM;5 reps;AROM;12 reps   Internal Rotation PROM;5 reps;AROM;12 reps   Flexion PROM;5 reps;AAROM;12 reps   ABduction PROM;5 reps;AROM;12 reps   Shoulder Exercises: Seated   Protraction AAROM;12 reps   Horizontal ABduction AAROM;12 reps   External Rotation AAROM;12 reps   Internal Rotation AAROM;12 reps   Flexion AAROM;12 reps   Abduction AAROM;12 reps   Shoulder Exercises: Standing   Extension Theraband;10 reps   Theraband Level (Shoulder Extension) Level 2 (  Red)   Row Theraband;10 reps   Theraband Level (Shoulder Row) Level 2 (Red)   Retraction Theraband;10 reps   Theraband Level (Shoulder Retraction) Level 2 (Red)   Shoulder Exercises: ROM/Strengthening   UBE (Upper Arm Bike) Level 1 3' forward 3' reverse   X to V Arms 10X   Proximal Shoulder Strengthening, Supine 12X no rest breaks   Proximal Shoulder Strengthening, Seated 12X no rest breaks   Manual Therapy   Manual Therapy Myofascial release   Manual therapy comments manual therapy completed prior to other interventions completed this date.     Myofascial Release Myofascial release and manual stretching to left upper arm, scapular region, shoulder region and associate areas to decrease pain and restrictions that are  limiting full range needed to resume full activities                  OT Short Term Goals - 04/09/15 1332    OT SHORT TERM GOAL #1   Title Patient will be educated and independent with HEP to increase functional performance when using LUE.   Time 3   Period Weeks   Status On-going   OT SHORT TERM GOAL #2   Title Patient will increase P/ROM to WNL to increase ability to reach behind back to pull up pants.    Time 3   Period Weeks   Status On-going   OT SHORT TERM GOAL #3   Title Patient will increase LUE strength to 4/5 to increase ability to return to light household chores.    Time 3   Period Weeks   Status Achieved   OT SHORT TERM GOAL #4   Title Patient will decrease pain level to 4/10 when completing movements that require her to reach out to the side.    Time 3   Period Weeks   Status Achieved   OT SHORT TERM GOAL #5   Title Patient will decrease fascial restrictions from a max amount to a mod amount.   Time 3   Period Weeks   Status Achieved           OT Long Term Goals - 03/20/15 1422    OT LONG TERM GOAL #1   Title Patient will return to her highest level of independence with all daily and househould activities using her LUE.   Time 6   Period Weeks   Status On-going   OT LONG TERM GOAL #2   Title Patient will increase LUE strength to 4+/5 to increase ability to return to normal daily and household tasks.   Time 6   Period Weeks   Status On-going   OT LONG TERM GOAL #3   Title Patient will increase A/ROM to [facility] to increase ability to complete dressing tasks with less difficulty.    Time 6   Period Weeks   Status On-going   OT LONG TERM GOAL #4   Title Patient will decrease pain level to 2/10 or less when using LUE during daily tasks.    Time 6   Period Weeks   Status On-going   OT LONG TERM GOAL #5   Title Patient will decrease fascial restrictions to a min amount to increase functional mobility of LUE.   Time 6   Period Weeks   Status  On-going               Plan - 04/09/15 1642    Clinical Impression Statement A: Mini-reassessment completed this session, pt has met 3/5  STGs and is progressing towards remaining goals. Pt reports she is using her arm for various tasks daily and only has pain if she stretches it too far. Pt does report falling this weekend and hurting a rib on her left side.    Plan P: Continue working to increase P/ROM and A/ROM to WNL, as well as increase strength to increase functional use of pt's LUE during daily tasks.         Problem List There are no active problems to display for this patient.   Guadelupe Sabin, OTR/L  [phone]  04/09/2015, 4:47 PM  [facility] [address] [place], [place], [address] Phone: [phone]   Fax:  [phone]  Name: YOANNA JURCZYK MRN: [id number] Date of Birth: Jan 26, 1933

## 2015-04-11 ENCOUNTER — Ambulatory Visit (HOSPITAL_COMMUNITY): Payer: Medicare Other | Admitting: Occupational Therapy

## 2015-04-11 ENCOUNTER — Encounter (HOSPITAL_COMMUNITY): Payer: Self-pay | Admitting: Occupational Therapy

## 2015-04-11 DIAGNOSIS — M25512 Pain in left shoulder: Secondary | ICD-10-CM | POA: Diagnosis not present

## 2015-04-11 DIAGNOSIS — R29898 Other symptoms and signs involving the musculoskeletal system: Secondary | ICD-10-CM

## 2015-04-11 DIAGNOSIS — M6289 Other specified disorders of muscle: Secondary | ICD-10-CM

## 2015-04-11 DIAGNOSIS — M629 Disorder of muscle, unspecified: Secondary | ICD-10-CM | POA: Diagnosis not present

## 2015-04-11 DIAGNOSIS — M25612 Stiffness of left shoulder, not elsewhere classified: Secondary | ICD-10-CM | POA: Diagnosis not present

## 2015-04-11 NOTE — Therapy (Signed)
[place], [place], [address] Phone: [phone]   Fax:  [phone]  Occupational Therapy Treatment  Patient Details  Name: [name] MRN: [id number] Date of Birth: November 24, 1932 Referring Provider: Sanjuana Kava  Encounter Date: 04/11/2015      OT End of Session - 04/11/15 1341    Visit Number 8   Number of Visits 16   Date for OT Re-Evaluation 05/11/15   Authorization Type Medicare part A   Authorization Time Period before 10th visit   Authorization - Visit Number 8   Authorization - Number of Visits 10   OT Start Time 1302   OT Stop Time 1346   OT Time Calculation (min) 44 min   Activity Tolerance Patient tolerated treatment well   Behavior During Therapy [facility] for tasks assessed/performed      History reviewed. No pertinent past medical history.  Past Surgical History  Procedure Laterality Date  . Tonsillectomy    . Appendectomy    . Ovarian cyst removal    . Cholecystectomy    . Eye surgery      There were no vitals filed for this visit.  Visit Diagnosis:  Pain in joint of left shoulder  Shoulder weakness  Tight fascia  Shoulder stiffness, left      Subjective Assessment - 04/11/15 1305    Subjective  S: It was feeling better and then I carried some heavy groceries and it started hurting again.    Currently in Pain? Yes   Pain Score 3    Pain Location Shoulder   Pain Orientation Left   Pain Descriptors / Indicators Aching   Pain Type Acute pain            [facility] OT Assessment - 04/11/15 1323    Assessment   Diagnosis left shoulder pain   Precautions   Precautions None                  OT Treatments/Exercises (OP) - 04/11/15 1320    Exercises   Exercises Shoulder   Shoulder Exercises: Supine   Protraction PROM;5 reps;AROM;10 reps   Horizontal ABduction PROM;5 reps;AROM;10 reps   External Rotation PROM;5 reps;AROM;10 reps   Internal Rotation PROM;5  reps;AROM;10 reps   Flexion PROM;5 reps;AROM;10 reps   ABduction PROM;5 reps;AROM;10 reps   Shoulder Exercises: Standing   Protraction AROM;12 reps   Horizontal ABduction AROM;12 reps   External Rotation AROM;12 reps   Internal Rotation AROM;12 reps   Flexion AROM;12 reps   ABduction AROM;12 reps   Extension Theraband;12 reps   Theraband Level (Shoulder Extension) Level 2 (Red)   Row Theraband;12 reps   Theraband Level (Shoulder Row) Level 2 (Red)   Retraction Theraband;12 reps   Theraband Level (Shoulder Retraction) Level 2 (Red)   Shoulder Exercises: ROM/Strengthening   UBE (Upper Arm Bike) Level 1 3' forward 3' reverse   X to V Arms 12X    Proximal Shoulder Strengthening, Supine 12X no rest breaks   Proximal Shoulder Strengthening, Seated 12X no rest breaks   Manual Therapy   Manual Therapy Myofascial release   Manual therapy comments manual therapy completed prior to other interventions completed this date.     Myofascial Release Myofascial release and manual stretching to left upper arm, scapular region, shoulder region and associate areas to decrease pain and restrictions that are limiting full range needed to resume full activities  OT Education - 04/11/15 1340    Education provided Yes   Education Details Red scapular theraband exercises   Person(s) Educated Patient   Methods Explanation;Demonstration;Handout   Comprehension Verbalized understanding;Returned demonstration          OT Short Term Goals - 04/09/15 1332    OT SHORT TERM GOAL #1   Title Patient will be educated and independent with HEP to increase functional performance when using LUE.   Time 3   Period Weeks   Status On-going   OT SHORT TERM GOAL #2   Title Patient will increase P/ROM to WNL to increase ability to reach behind back to pull up pants.    Time 3   Period Weeks   Status On-going   OT SHORT TERM GOAL #3   Title Patient will increase LUE strength to 4/5 to  increase ability to return to light household chores.    Time 3   Period Weeks   Status Achieved   OT SHORT TERM GOAL #4   Title Patient will decrease pain level to 4/10 when completing movements that require her to reach out to the side.    Time 3   Period Weeks   Status Achieved   OT SHORT TERM GOAL #5   Title Patient will decrease fascial restrictions from a max amount to a mod amount.   Time 3   Period Weeks   Status Achieved           OT Long Term Goals - 03/20/15 1422    OT LONG TERM GOAL #1   Title Patient will return to her highest level of independence with all daily and househould activities using her LUE.   Time 6   Period Weeks   Status On-going   OT LONG TERM GOAL #2   Title Patient will increase LUE strength to 4+/5 to increase ability to return to normal daily and household tasks.   Time 6   Period Weeks   Status On-going   OT LONG TERM GOAL #3   Title Patient will increase A/ROM to [facility] to increase ability to complete dressing tasks with less difficulty.    Time 6   Period Weeks   Status On-going   OT LONG TERM GOAL #4   Title Patient will decrease pain level to 2/10 or less when using LUE during daily tasks.    Time 6   Period Weeks   Status On-going   OT LONG TERM GOAL #5   Title Patient will decrease fascial restrictions to a min amount to increase functional mobility of LUE.   Time 6   Period Weeks   Status On-going               Plan - 04/11/15 1341    Clinical Impression Statement A: A/ROM exercises added this session, provided pt with red scapular theraband HEP. Pt reports she has been feeling good, however experienced increased pain after carrying heavy groceries from the car to the house. Pt requires intermittent cuing for form during exercises.    Plan P: Follow up on scapular theraband exercises, add overhead lacing, w arms. Provide A/ROM HEP         Problem List There are no active problems to display for this  patient.   Guadelupe Sabin, OTR/L  [phone]  04/11/2015, 2:24 PM  [facility] [address] [place], [place], [address] Phone: [phone]   Fax:  [phone]  Name: Rebecca Inzer  C[place] MRN: [id number] Date of Birth: [birth year]

## 2015-04-11 NOTE — Patient Instructions (Signed)

## 2015-04-16 ENCOUNTER — Encounter (HOSPITAL_COMMUNITY): Payer: Self-pay | Admitting: Occupational Therapy

## 2015-04-16 ENCOUNTER — Ambulatory Visit (HOSPITAL_COMMUNITY): Payer: Medicare Other | Admitting: Occupational Therapy

## 2015-04-16 DIAGNOSIS — M25612 Stiffness of left shoulder, not elsewhere classified: Secondary | ICD-10-CM

## 2015-04-16 DIAGNOSIS — R29898 Other symptoms and signs involving the musculoskeletal system: Secondary | ICD-10-CM

## 2015-04-16 DIAGNOSIS — M6289 Other specified disorders of muscle: Secondary | ICD-10-CM

## 2015-04-16 DIAGNOSIS — M25512 Pain in left shoulder: Secondary | ICD-10-CM | POA: Diagnosis not present

## 2015-04-16 DIAGNOSIS — M629 Disorder of muscle, unspecified: Secondary | ICD-10-CM

## 2015-04-16 NOTE — Patient Instructions (Signed)
1) Shoulder Protraction    Begin with elbows by your side, slowly "punch" straight out in front of you keeping arms/elbows straight. Repeat _15__times, __2__set/day.     2) Shoulder Flexion  Supine:     Standing:         Begin with arms at your side with thumbs pointed up, slowly raise both arms up and forward towards overhead. Repeat__15_times, _2__set/day.               3) Horizontal abduction/adduction  Supine:   Standing:           Begin with arms straight out in front of you, bring out to the side in at "T" shape. Keep arms straight entire time. Repeat _15___times, __2__sets/day.                 4) Internal & External Rotation    *No band* -Stand with elbows at the side and elbows bent 90 degrees. Move your forearms away from your body, then bring back inward toward the body.  Repeat _15__times, _2__sets/day    5) Shoulder Abduction  Supine:     Standing:       Lying on your back begin with your arms flat on the table next to your side. Slowly move your arms out to the side so that they go overhead, in a jumping jack or snow angel movement. Repeat _15__times, _2__sets/day

## 2015-04-16 NOTE — Therapy (Signed)
[place], [place], [address] Phone: [phone]   Fax:  [phone]  Occupational Therapy Treatment  Patient Details  Name: Rebecca Cordova MRN: [id number] Date of Birth: September 26, 1932 Referring Provider: Sanjuana Kava  Encounter Date: 04/16/2015      OT End of Session - 04/16/15 1339    Visit Number 9   Number of Visits 16   Date for OT Re-Evaluation 05/11/15   Authorization Type Medicare part A   Authorization Time Period before 19th visit   Authorization - Visit Number 9   Authorization - Number of Visits 10   OT Start Time 1301   OT Stop Time 1345   OT Time Calculation (min) 44 min   Activity Tolerance Patient tolerated treatment well   Behavior During Therapy [facility] for tasks assessed/performed      History reviewed. No pertinent past medical history.  Past Surgical History  Procedure Laterality Date  . Tonsillectomy    . Appendectomy    . Ovarian cyst removal    . Cholecystectomy    . Eye surgery      There were no vitals filed for this visit.  Visit Diagnosis:  Pain in joint of left shoulder  Shoulder weakness  Tight fascia  Shoulder stiffness, left      Subjective Assessment - 04/16/15 1303    Subjective  S: It was feeling pretty good but it's still hurting in the front of my shoulder.    Special Tests FOTO Score: 81/100 (19% impairment)   Currently in Pain? Yes   Pain Score 2    Pain Location Shoulder   Pain Orientation Left   Pain Descriptors / Indicators Aching   Pain Type Acute pain            [facility] OT Assessment - 04/16/15 1303    Assessment   Diagnosis left shoulder pain   Precautions   Precautions None                  OT Treatments/Exercises (OP) - 04/16/15 1304    Exercises   Exercises Shoulder   Shoulder Exercises: Supine   Protraction PROM;5 reps;AROM;10 reps   Horizontal ABduction PROM;5 reps;AROM;10 reps   External Rotation PROM;5 reps;AROM;10  reps   Internal Rotation PROM;5 reps;AROM;10 reps   Flexion PROM;5 reps;AROM;10 reps   ABduction PROM;5 reps;AROM;10 reps   Shoulder Exercises: Standing   Protraction AROM;12 reps   Horizontal ABduction AROM;12 reps   External Rotation AROM;12 reps   Internal Rotation AROM;12 reps   Flexion AROM;12 reps   ABduction AROM;12 reps   Shoulder Exercises: ROM/Strengthening   UBE (Upper Arm Bike) Level 1 3' forward 3' reverse   Over Head Lace 1'30"   "W" Arms 10X   X to V Arms 12X    Proximal Shoulder Strengthening, Supine 12X no rest breaks   Proximal Shoulder Strengthening, Seated 12X no rest breaks   Prot/Ret//Elev/Dep 1'  OT facilitation    Manual Therapy   Manual Therapy Myofascial release   Manual therapy comments manual therapy completed prior to other interventions completed this date.     Myofascial Release Myofascial release and manual stretching to left upper arm, scapular region, shoulder region and associate areas to decrease pain and restrictions that are limiting full range needed to resume full activities                 OT Education - 04/16/15 1339    Education provided  Yes   Education Details A/ROM exercises   Person(s) Educated Patient   Methods Explanation;Demonstration;Handout   Comprehension Verbalized understanding;Returned demonstration          OT Short Term Goals - 04/09/15 1332    OT SHORT TERM GOAL #1   Title Patient will be educated and independent with HEP to increase functional performance when using LUE.   Time 3   Period Weeks   Status On-going   OT SHORT TERM GOAL #2   Title Patient will increase P/ROM to WNL to increase ability to reach behind back to pull up pants.    Time 3   Period Weeks   Status On-going   OT SHORT TERM GOAL #3   Title Patient will increase LUE strength to 4/5 to increase ability to return to light household chores.    Time 3   Period Weeks   Status Achieved   OT SHORT TERM GOAL #4   Title Patient will  decrease pain level to 4/10 when completing movements that require her to reach out to the side.    Time 3   Period Weeks   Status Achieved   OT SHORT TERM GOAL #5   Title Patient will decrease fascial restrictions from a max amount to a mod amount.   Time 3   Period Weeks   Status Achieved           OT Long Term Goals - 03/20/15 1422    OT LONG TERM GOAL #1   Title Patient will return to her highest level of independence with all daily and househould activities using her LUE.   Time 6   Period Weeks   Status On-going   OT LONG TERM GOAL #2   Title Patient will increase LUE strength to 4+/5 to increase ability to return to normal daily and household tasks.   Time 6   Period Weeks   Status On-going   OT LONG TERM GOAL #3   Title Patient will increase A/ROM to [facility] to increase ability to complete dressing tasks with less difficulty.    Time 6   Period Weeks   Status On-going   OT LONG TERM GOAL #4   Title Patient will decrease pain level to 2/10 or less when using LUE during daily tasks.    Time 6   Period Weeks   Status On-going   OT LONG TERM GOAL #5   Title Patient will decrease fascial restrictions to a min amount to increase functional mobility of LUE.   Time 6   Period Weeks   Status On-going                 G-Codes - 2015-04-19 1411    Functional Assessment Tool Used FOTO Score: 81/100 (19% impairment)   Functional Limitation Carrying, moving and handling objects   Carrying, Moving and Handling Objects Current Status [phone]) At least 1 percent but less than 20 percent impaired, limited or restricted   Carrying, Moving and Handling Objects Goal Status (T0569) 0 percent impaired, limited or restricted      Problem List There are no active problems to display for this patient.   Guadelupe Sabin, OTR/L  [phone]  04/19/2015, 2:13 PM  [facility] [address] [place], [place], [address] Phone:  [phone]   Fax:  [phone]  Name: Rebecca Cordova MRN: [id number] Date of Birth: [birth year]

## 2015-04-18 ENCOUNTER — Encounter (HOSPITAL_COMMUNITY): Payer: Self-pay | Admitting: Occupational Therapy

## 2015-04-18 ENCOUNTER — Ambulatory Visit (HOSPITAL_COMMUNITY): Payer: Medicare Other | Admitting: Occupational Therapy

## 2015-04-18 NOTE — Therapy (Signed)
[facility] [address] [place], [place], [address] Phone: [phone]   Fax:  [phone]  Patient Details  Name: Rebecca Cordova MRN: [id number] Date of Birth: 1932-08-18 Referring Provider:  No ref. provider found  Encounter Date: 04/18/2015   OCCUPATIONAL THERAPY DISCHARGE SUMMARY  Visits from Start of Care: 9  Current functional level related to goals / functional outcomes: See goals below. Pt reports she complete all daily tasks as she needs to. She is now able to lift her arm over her head to reach into high cabinets. Pt is requesting to be discharged due to being happy with current function of her LUE.     OT Short Term Goals - 04/09/15 1332    OT SHORT TERM GOAL #1   Title Patient will be educated and independent with HEP to increase functional performance when using LUE.   Time 3   Period Weeks   Status Achieved   OT SHORT TERM GOAL #2   Title Patient will increase P/ROM to WNL to increase ability to reach behind back to pull up pants.    Time 3   Period Weeks   Status Achieved   OT SHORT TERM GOAL #3   Title Patient will increase LUE strength to 4/5 to increase ability to return to light household chores.    Time 3   Period Weeks   Status Achieved   OT SHORT TERM GOAL #4   Title Patient will decrease pain level to 4/10 when completing movements that require her to reach out to the side.    Time 3   Period Weeks   Status Achieved   OT SHORT TERM GOAL #5   Title Patient will decrease fascial restrictions from a max amount to a mod amount.   Time 3   Period Weeks   Status Achieved           OT Long Term Goals - 03/20/15 1422    OT LONG TERM GOAL #1   Title Patient will return to her highest level of independence with all daily and househould activities using her LUE.   Time 6   Period Weeks   Status Achieved   OT LONG  TERM GOAL #2   Title Patient will increase LUE strength to 4+/5 to increase ability to return to normal daily and household tasks.   Time 6   Period Weeks   Status Partially met   OT LONG TERM GOAL #3   Title Patient will increase A/ROM to [facility] to increase ability to complete dressing tasks with less difficulty.    Time 6   Period Weeks   Status Achieved   OT LONG TERM GOAL #4   Title Patient will decrease pain level to 2/10 or less when using LUE during daily tasks.    Time 6   Period Weeks   Status Not met   OT LONG TERM GOAL #5   Title Patient will decrease fascial restrictions to a min amount to increase functional mobility of LUE.   Time 6   Period Weeks   Status Not met          Remaining deficits: Pt continues to experience pain at times, especially when using the arm a great deal (housework tasks).    Education / Equipment: Pt has been provided with updated HEP throughout her time in therapy, including A/ROM and scapular theraband most recently.   Plan: Patient agrees to discharge.  Patient goals were  partially met. Patient is being discharged due to being pleased with the current functional level.  ?????       Guadelupe Sabin, OTR/L  [phone]  04/18/2015, 10:27 AM  [facility] [address] [place], [place], [address] Phone: [phone]   Fax:  [phone]

## 2015-05-08 DIAGNOSIS — N819 Female genital prolapse, unspecified: Secondary | ICD-10-CM | POA: Diagnosis not present

## 2015-09-23 DIAGNOSIS — G459 Transient cerebral ischemic attack, unspecified: Secondary | ICD-10-CM | POA: Diagnosis not present

## 2015-09-23 DIAGNOSIS — Z79899 Other long term (current) drug therapy: Secondary | ICD-10-CM | POA: Diagnosis not present

## 2015-09-23 DIAGNOSIS — E785 Hyperlipidemia, unspecified: Secondary | ICD-10-CM | POA: Diagnosis not present

## 2015-10-02 DIAGNOSIS — F419 Anxiety disorder, unspecified: Secondary | ICD-10-CM | POA: Diagnosis not present

## 2015-10-02 DIAGNOSIS — Z8673 Personal history of transient ischemic attack (TIA), and cerebral infarction without residual deficits: Secondary | ICD-10-CM | POA: Diagnosis not present

## 2015-10-02 DIAGNOSIS — Z6822 Body mass index (BMI) 22.0-22.9, adult: Secondary | ICD-10-CM | POA: Diagnosis not present

## 2015-10-08 DIAGNOSIS — R5382 Chronic fatigue, unspecified: Secondary | ICD-10-CM | POA: Diagnosis not present

## 2015-11-13 DIAGNOSIS — Z1289 Encounter for screening for malignant neoplasm of other sites: Secondary | ICD-10-CM | POA: Diagnosis not present

## 2016-01-28 DIAGNOSIS — Z23 Encounter for immunization: Secondary | ICD-10-CM | POA: Diagnosis not present

## 2016-02-24 DIAGNOSIS — K120 Recurrent oral aphthae: Secondary | ICD-10-CM | POA: Diagnosis not present

## 2016-04-03 DIAGNOSIS — Z8673 Personal history of transient ischemic attack (TIA), and cerebral infarction without residual deficits: Secondary | ICD-10-CM | POA: Diagnosis not present

## 2016-04-03 DIAGNOSIS — F411 Generalized anxiety disorder: Secondary | ICD-10-CM | POA: Diagnosis not present

## 2016-05-20 DIAGNOSIS — N814 Uterovaginal prolapse, unspecified: Secondary | ICD-10-CM | POA: Diagnosis not present

## 2016-06-04 DIAGNOSIS — L570 Actinic keratosis: Secondary | ICD-10-CM | POA: Diagnosis not present

## 2016-06-04 DIAGNOSIS — D225 Melanocytic nevi of trunk: Secondary | ICD-10-CM | POA: Diagnosis not present

## 2016-06-04 DIAGNOSIS — X32XXXD Exposure to sunlight, subsequent encounter: Secondary | ICD-10-CM | POA: Diagnosis not present

## 2016-09-20 IMAGING — MG MM DIGITAL SCREENING
8 series · 8 of 24 positions shown · non-contrast
Comparison: Previous exam(s).

CLINICAL DATA: Screening.

EXAM:
DIGITAL SCREENING BILATERAL MAMMOGRAM WITH 3D TOMO WITH CAD

[R MLO]
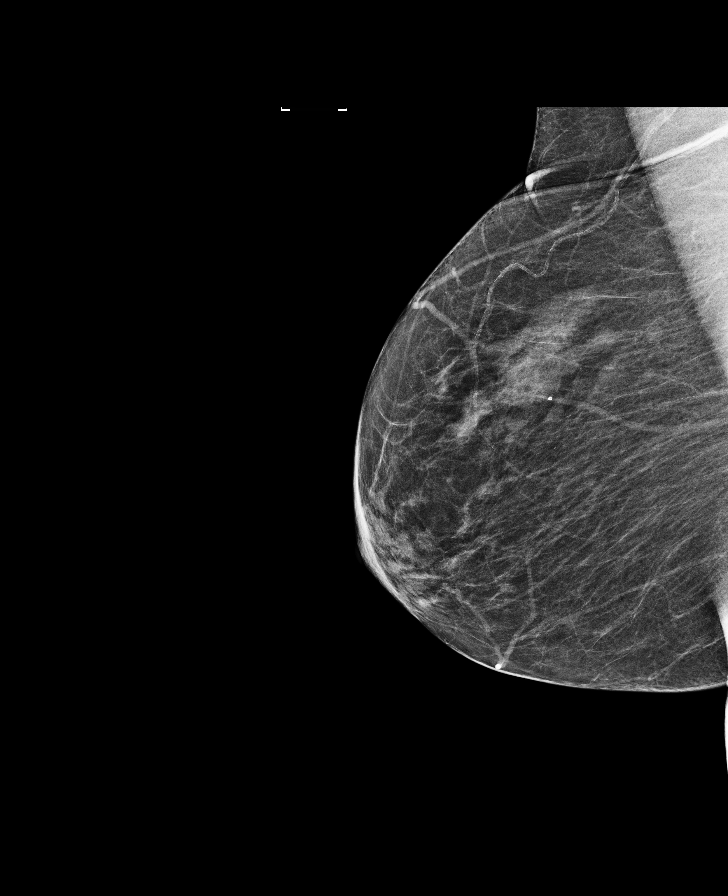

[L MLO]
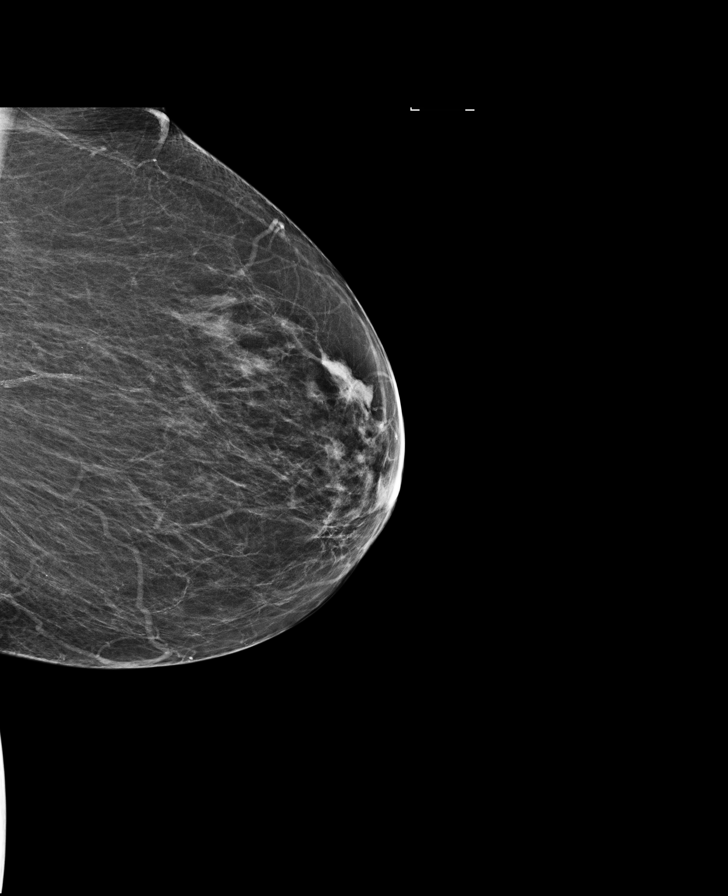

[R CC]
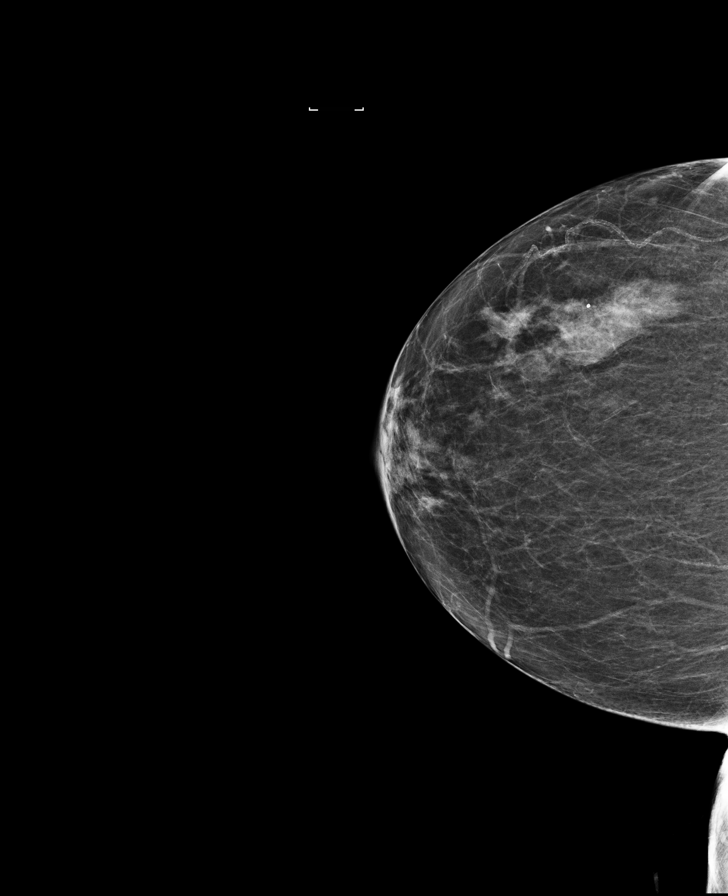

[L CC]
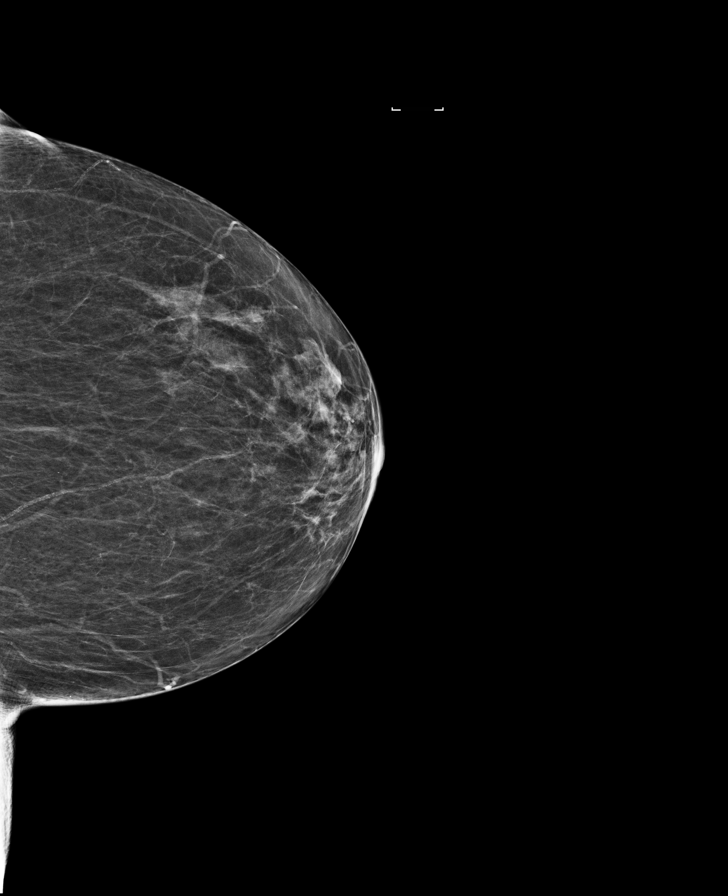

[L CC tomo · tomo slice 27/52.0]
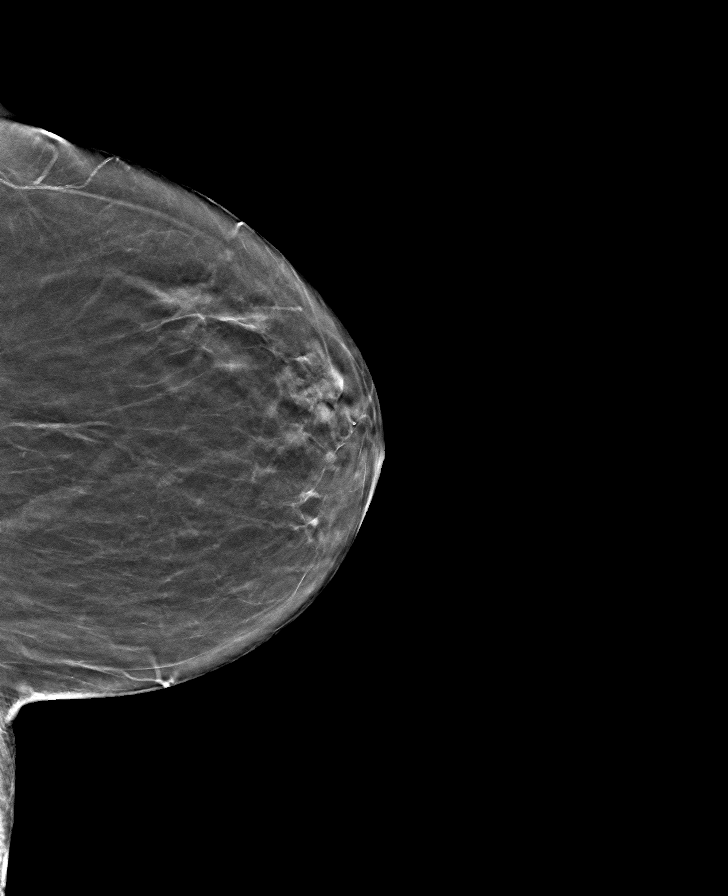

[L MLO tomo · tomo slice 27/52.0]
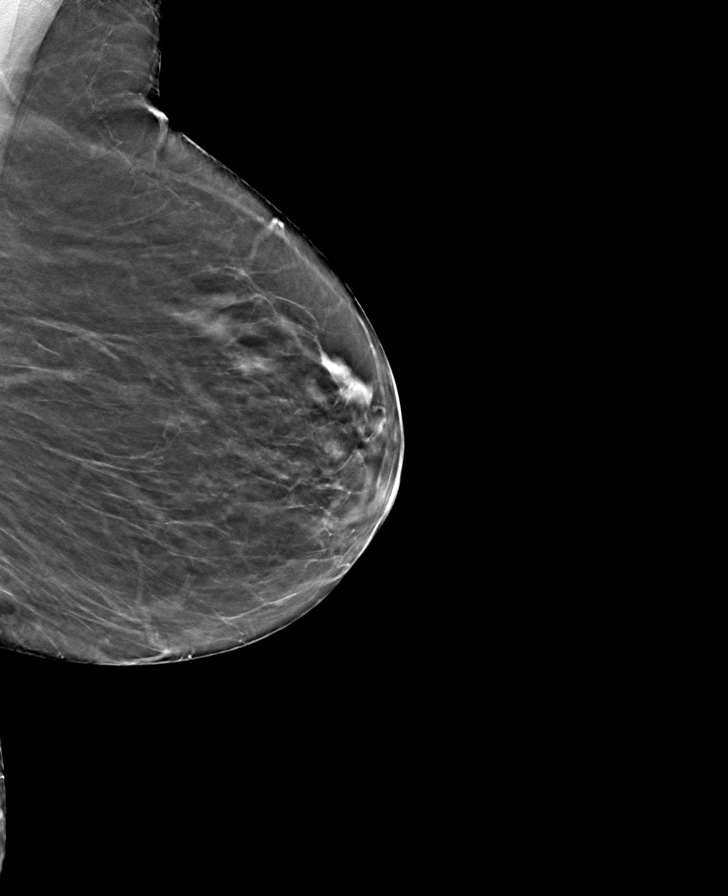

[R MLO tomo · tomo slice 29/56.0]
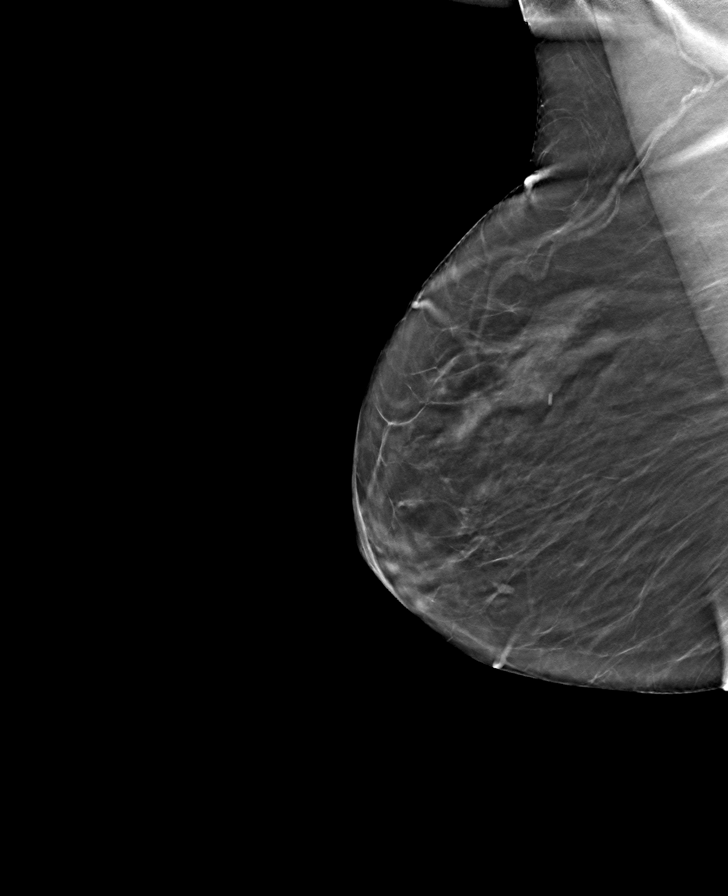

[R CC tomo · tomo slice 25/49.0]
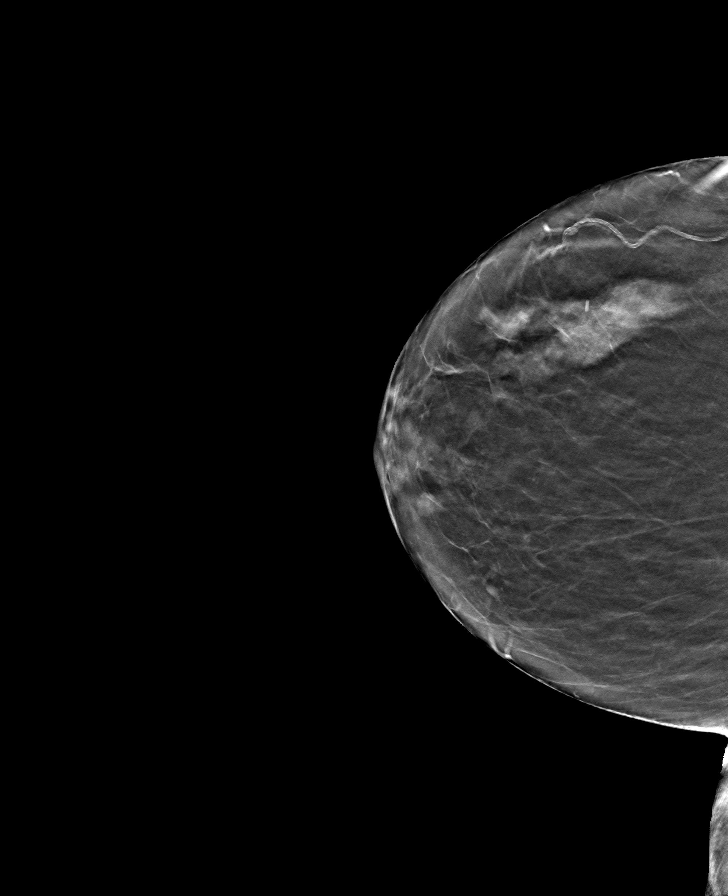

[8 of 24 positions shown; findings below may reference images not displayed]

ACR Breast Density Category b: There are scattered areas of
fibroglandular density.
FINDINGS: There are no findings suspicious for malignancy. Images were
processed with CAD.
IMPRESSION: No mammographic evidence of malignancy. A result letter of this
screening mammogram will be mailed directly to the patient.

RECOMMENDATION:
Screening mammogram in one year. (Code:55-L-[id number])

BI-RADS CATEGORY  1: Negative.

## 2016-09-28 DIAGNOSIS — Z79899 Other long term (current) drug therapy: Secondary | ICD-10-CM | POA: Diagnosis not present

## 2016-09-28 DIAGNOSIS — G459 Transient cerebral ischemic attack, unspecified: Secondary | ICD-10-CM | POA: Diagnosis not present

## 2016-09-28 DIAGNOSIS — F419 Anxiety disorder, unspecified: Secondary | ICD-10-CM | POA: Diagnosis not present

## 2016-10-06 DIAGNOSIS — F411 Generalized anxiety disorder: Secondary | ICD-10-CM | POA: Diagnosis not present

## 2016-10-06 DIAGNOSIS — G459 Transient cerebral ischemic attack, unspecified: Secondary | ICD-10-CM | POA: Diagnosis not present

## 2016-10-06 DIAGNOSIS — Z6821 Body mass index (BMI) 21.0-21.9, adult: Secondary | ICD-10-CM | POA: Diagnosis not present

## 2016-10-28 DIAGNOSIS — N814 Uterovaginal prolapse, unspecified: Secondary | ICD-10-CM | POA: Diagnosis not present

## 2016-12-09 DIAGNOSIS — Z1231 Encounter for screening mammogram for malignant neoplasm of breast: Secondary | ICD-10-CM | POA: Diagnosis not present

## 2016-12-09 DIAGNOSIS — N816 Rectocele: Secondary | ICD-10-CM | POA: Diagnosis not present

## 2016-12-09 DIAGNOSIS — N814 Uterovaginal prolapse, unspecified: Secondary | ICD-10-CM | POA: Diagnosis not present

## 2016-12-09 DIAGNOSIS — N8110 Cystocele, unspecified: Secondary | ICD-10-CM | POA: Diagnosis not present

## 2016-12-09 DIAGNOSIS — N898 Other specified noninflammatory disorders of vagina: Secondary | ICD-10-CM | POA: Diagnosis not present

## 2016-12-15 ENCOUNTER — Other Ambulatory Visit: Payer: Self-pay | Admitting: Obstetrics & Gynecology

## 2016-12-15 DIAGNOSIS — R928 Other abnormal and inconclusive findings on diagnostic imaging of breast: Secondary | ICD-10-CM

## 2016-12-17 ENCOUNTER — Emergency Department (HOSPITAL_COMMUNITY): Payer: Medicare Other

## 2016-12-17 ENCOUNTER — Other Ambulatory Visit: Payer: Self-pay

## 2016-12-17 ENCOUNTER — Other Ambulatory Visit: Payer: Self-pay | Admitting: Obstetrics & Gynecology

## 2016-12-17 ENCOUNTER — Emergency Department (HOSPITAL_COMMUNITY)
Admission: EM | Admit: 2016-12-17 | Discharge: 2016-12-17 | Disposition: A | Discharge status: Home / Self Care | Payer: Medicare Other | Attending: Emergency Medicine | Admitting: Emergency Medicine

## 2016-12-17 DIAGNOSIS — R928 Other abnormal and inconclusive findings on diagnostic imaging of breast: Secondary | ICD-10-CM

## 2016-12-17 DIAGNOSIS — Z79899 Other long term (current) drug therapy: Secondary | ICD-10-CM | POA: Diagnosis not present

## 2016-12-17 DIAGNOSIS — N10 Acute tubulo-interstitial nephritis: Secondary | ICD-10-CM | POA: Insufficient documentation

## 2016-12-17 DIAGNOSIS — Z7982 Long term (current) use of aspirin: Secondary | ICD-10-CM | POA: Insufficient documentation

## 2016-12-17 DIAGNOSIS — Z87891 Personal history of nicotine dependence: Secondary | ICD-10-CM | POA: Diagnosis not present

## 2016-12-17 DIAGNOSIS — R1084 Generalized abdominal pain: Secondary | ICD-10-CM | POA: Diagnosis present

## 2016-12-17 DIAGNOSIS — N200 Calculus of kidney: Secondary | ICD-10-CM | POA: Diagnosis not present

## 2016-12-17 DIAGNOSIS — N201 Calculus of ureter: Secondary | ICD-10-CM | POA: Insufficient documentation

## 2016-12-17 DIAGNOSIS — N12 Tubulo-interstitial nephritis, not specified as acute or chronic: Secondary | ICD-10-CM

## 2016-12-17 LAB — URINALYSIS, ROUTINE W REFLEX MICROSCOPIC
GLUCOSE, UA: NEGATIVE mg/dL
Nitrite: NEGATIVE
PH: 5 (ref 5.0–8.0)
Protein, ur: 100 mg/dL — AB

## 2016-12-17 LAB — BASIC METABOLIC PANEL
Anion gap: 9 (ref 5–15)
BUN: 30 mg/dL — AB (ref 6–20)
CHLORIDE: 105 mmol/L (ref 101–111)
CO2: 24 mmol/L (ref 22–32)
CREATININE: 0.94 mg/dL (ref 0.44–1.00)
Calcium: 9.4 mg/dL (ref 8.9–10.3)
GFR calc Af Amer: >60 mL/min (ref >60)
GFR calc non Af Amer: 55 mL/min — ABNORMAL LOW (ref >60)
GLUCOSE: 135 mg/dL — AB (ref 65–99)
POTASSIUM: 3.9 mmol/L (ref 3.5–5.1)
Sodium: 138 mmol/L (ref 135–145)

## 2016-12-17 LAB — CBC WITH DIFFERENTIAL/PLATELET
Basophils Absolute: 0 10*3/uL (ref 0.0–0.1)
Basophils Relative: 0 %
Eosinophils Absolute: 0 10*3/uL (ref 0.0–0.7)
Eosinophils Relative: 0 %
HCT: 41 % (ref 36.0–46.0)
HEMOGLOBIN: 13.4 g/dL (ref 12.0–15.0)
LYMPHS ABS: 1 10*3/uL (ref 0.7–4.0)
LYMPHS PCT: 7 %
MCH: 31.5 pg (ref 26.0–34.0)
MCHC: 32.7 g/dL (ref 30.0–36.0)
MCV: 96.2 fL (ref 78.0–100.0)
MONOS PCT: 4 %
Monocytes Absolute: 0.6 10*3/uL (ref 0.1–1.0)
NEUTROS PCT: 89 %
Neutro Abs: 12.7 10*3/uL — ABNORMAL HIGH (ref 1.7–7.7)
Platelets: 290 10*3/uL (ref 150–400)
RBC: 4.26 MIL/uL (ref 3.87–5.11)
RDW: 13.1 % (ref 11.5–15.5)
WBC: 14.3 10*3/uL — ABNORMAL HIGH (ref 4.0–10.5)

## 2016-12-17 LAB — URINALYSIS, MICROSCOPIC (REFLEX)

## 2016-12-17 MED ORDER — CEPHALEXIN 500 MG PO CAPS: 500.0000 mg | ORAL_CAPSULE | Freq: Four times a day (QID) | ORAL | 0 refills | Status: AC

## 2016-12-17 MED ORDER — KETOROLAC TROMETHAMINE 30 MG/ML IJ SOLN
15.0000 mg | Freq: Once | INTRAMUSCULAR | Status: AC | PRN
  Administered 2016-12-17: 15 mg via INTRAVENOUS
  Filled 2016-12-17: qty 1

## 2016-12-17 MED ORDER — SODIUM CHLORIDE 0.9 % IV BOLUS (SEPSIS)
1000.0000 mL | Freq: Once | INTRAVENOUS | Status: AC
  Administered 2016-12-17: 1000 mL via INTRAVENOUS

## 2016-12-17 MED ORDER — DEXTROSE 5 % IV SOLN
1.0000 g | Freq: Once | INTRAVENOUS | Status: AC
  Administered 2016-12-17: 1 g via INTRAVENOUS
  Filled 2016-12-17: qty 10

## 2016-12-17 MED ORDER — ONDANSETRON HCL 4 MG/2ML IJ SOLN
4.0000 mg | Freq: Once | INTRAMUSCULAR | Status: AC
  Administered 2016-12-17: 4 mg via INTRAVENOUS
  Filled 2016-12-17: qty 2

## 2016-12-17 MED ORDER — HYDROMORPHONE HCL 1 MG/ML IJ SOLN
0.5000 mg | Freq: Once | INTRAMUSCULAR | Status: AC
  Administered 2016-12-17: 0.5 mg via INTRAVENOUS
  Filled 2016-12-17: qty 1

## 2016-12-17 NOTE — ED Triage Notes (Signed)
Right flank pain, some blood in urine yesterday, hx kidney stones. Seeing GYN for other problems

## 2016-12-17 NOTE — ED Notes (Signed)
Advised not to drive, states she has a ride

## 2016-12-17 NOTE — ED Provider Notes (Signed)
[facility] DEPT Provider Note   CSN: [id number] Arrival date & time: 12/17/16  4270     History   Chief Complaint Chief Complaint  Patient presents with  . Flank Pain    HPI Rebecca Cordova is a 83 y.o. female.  The history is provided by the patient.  Flank Pain  This is a new problem. The current episode started 3 to 5 hours ago. The problem occurs rarely. The problem has not changed since onset.Associated symptoms include abdominal pain. Pertinent negatives include no chest pain, no headaches and no shortness of breath. Nothing aggravates the symptoms. Nothing relieves the symptoms. She has tried nothing for the symptoms. The treatment provided no relief.   83 year old female who presents with right flank pain starting suddenly at 3 a.m. This morning. Pain sharp, severe,radiates to the right lower quadrant. She has some urinary hesitancy this morning and intermittent hematuria over the past 1-2 days. Denies any dysuria or urinary frequency, fevers, nausea or vomiting, diarrhea or constipation. She has a history of cholecystectomy, appendectomy, and ovarian cyst removal. States that she has had history of kidney stones remotely when she was younger. Has not taking any medications for her symptoms. No alleviating or aggravating factors. No past medical history on file.  There are no active problems to display for this patient.   Past Surgical History:  Procedure Laterality Date  . APPENDECTOMY    . CHOLECYSTECTOMY    . EYE SURGERY    . OVARIAN CYST REMOVAL    . TONSILLECTOMY      OB History    No data available       Home Medications    Prior to Admission medications   Medication Sig Start Date End Date Taking? Authorizing Provider  acetaminophen (TYLENOL) 500 MG tablet Take 1,000 mg by mouth every 6 (six) hours as needed. Pain   Yes [provider]  aspirin EC 81 MG tablet Take 81 mg by mouth daily.   Yes [provider]  lisinopril  (PRINIVIL,ZESTRIL) 10 MG tablet Take 10 mg by mouth daily.   Yes [provider]  Multiple Vitamin (MULTIVITAMIN WITH MINERALS) TABS Take 1 tablet by mouth daily.   Yes [provider]  tetrahydrozoline-zinc (VISINE-AC) 0.05-0.25 % ophthalmic solution Place 2 drops into both eyes 3 (three) times daily as needed. Itchy/Red Eyes   Yes [provider]  cephALEXin (KEFLEX) 500 MG capsule Take 1 capsule (500 mg total) by mouth 4 (four) times daily. 12/17/16 12/27/16  Forde Dandy, MD    Family History No family history on file.  Social History Social History  Substance Use Topics  . Smoking status: Former Smoker    Years: 20.00    Types: Cigarettes    Quit date: 12/21/2006  . Smokeless tobacco: Not on file  . Alcohol use No     Allergies   Penicillins   Review of Systems Review of Systems  Constitutional: Negative for fever.  Respiratory: Negative for shortness of breath.   Cardiovascular: Negative for chest pain.  Gastrointestinal: Positive for abdominal pain.  Genitourinary: Positive for flank pain.  Neurological: Negative for headaches.  All other systems reviewed and are negative.    Physical Exam Updated Vital Signs BP (!) 149/69   Pulse 62   Temp 98.1 F (36.7 C) (Oral)   Resp 20   Ht 5\' 5"  (1.651 m)   Wt 57.2 kg (126 lb)   SpO2 96%   BMI 20.97 kg/m   Physical  Exam Physical Exam  Nursing note and vitals reviewed. Constitutional: Well developed, well nourished, non-toxic, and in no acute distress Head: Normocephalic and atraumatic.  Mouth/Throat: Oropharynx is clear and moist.  Neck: Normal range of motion. Neck supple.  Cardiovascular: Normal rate and regular rhythm.   Pulmonary/Chest: Effort normal and breath sounds normal.  Abdominal: Soft. There is right lower quadrant tenderness. There is no rebound and no guarding. Right CVA tenderness. Musculoskeletal: Normal range of motion.  Neurological: Alert, no facial droop, fluent  speech, moves all extremities symmetrically Skin: Skin is warm and dry.  Psychiatric: Cooperative   ED Treatments / Results  Labs (all labs ordered are listed, but only abnormal results are displayed) Labs Reviewed  CBC WITH DIFFERENTIAL/PLATELET - Abnormal; Notable for the following:       Result Value   WBC 14.3 (*)    Neutro Abs 12.7 (*)    All other components within normal limits  URINALYSIS, ROUTINE W REFLEX MICROSCOPIC - Abnormal; Notable for the following:    Color, Urine BROWN (*)    APPearance CLOUDY (*)    Specific Gravity, Urine >1.030 (*)    Hgb urine dipstick LARGE (*)    Bilirubin Urine SMALL (*)    Ketones, ur TRACE (*)    Protein, ur 100 (*)    Leukocytes, UA TRACE (*)    All other components within normal limits  BASIC METABOLIC PANEL - Abnormal; Notable for the following:    Glucose, Bld 135 (*)    BUN 30 (*)    GFR calc non Af Amer 55 (*)    All other components within normal limits  URINALYSIS, MICROSCOPIC (REFLEX) - Abnormal; Notable for the following:    Bacteria, UA FEW (*)    Squamous Epithelial / LPF 6-30 (*)    All other components within normal limits  URINE CULTURE    EKG  EKG Interpretation None       Radiology Ct Renal Stone Study  Result Date: 12/17/2016 CLINICAL DATA:  83 year old female with right flank pain and pain with urination. EXAM: CT ABDOMEN AND PELVIS WITHOUT CONTRAST TECHNIQUE: Multidetector CT imaging of the abdomen and pelvis was performed following the standard protocol without IV contrast. COMPARISON:  02/03/2011 FINDINGS: Lower chest: There is dependent bibasilar atelectasis, right greater than left. Note is made of a moderate hiatal hernia. The heart size normal. There are coronary artery calcifications. Hepatobiliary: No focal liver abnormality is seen. Status post cholecystectomy. No biliary dilatation. Pancreas: Unremarkable. No pancreatic ductal dilatation or surrounding inflammatory changes. Spleen: Normal in size  without focal abnormality. Adrenals/Urinary Tract: Adrenal glands are unremarkable. Note is again made of mild bilateral renal cortical thickening with marked renal sinus cysts bilaterally. The right kidney appears enlarged with moderate perinephric fat stranding, particularly at the lower pole. There is a likely component of superimposed mild hydronephrosis and ureteral dilatation on the right. While no definite nephroureterolithiasis is seen, several 3-4 mm stones are identified in the bladder suggesting recent passage. A nonobstructing 9 mm stone is noted at the left renal pelvis. The left kidney is otherwise unremarkable. Stomach/Bowel: Stomach is within normal limits. Appendix is surgically absent. No evidence of bowel wall thickening, distention, or inflammatory changes. Vascular/Lymphatic: Aortic atherosclerosis. No enlarged abdominal or pelvic lymph nodes. Reproductive: Uterus and bilateral adnexa are unremarkable. Note is made of a pessary device. Other: No abdominal wall hernia or abnormality. No abdominopelvic ascites. Musculoskeletal: No acute or significant osseous findings. An intraosseous pneumatocyst is identified at the left  sacroiliac joint. There are moderate-to-marked degenerative changes, most severe at L5-S1. IMPRESSION: 1. Right renal enlargement with moderate perinephric fat stranding suggesting an acute infectious/inflammatory process. Correlate with urinalysis. There is mild hydronephrosis and ureteral dilatation without nephroureterolithiasis. Several 3- 4 mm stones are identified in the bladder suggesting recent passage of stones. 2. Nonobstructing 9 mm left renal pelvis stone. 3. Moderate hiatal hernia. 4. Aortic and coronary artery atherosclerosis. 5. Other stable and chronic changes as detailed. Electronically Signed   By: Kristopher Oppenheim M.D.   On: 12/17/2016 10:36    Procedures Procedures (including critical care time)  Medications Ordered in ED Medications  cefTRIAXone  (ROCEPHIN) 1 g in dextrose 5 % 50 mL IVPB (1 g Intravenous New Bag/Given 12/17/16 1054)  ondansetron (ZOFRAN) injection 4 mg (4 mg Intravenous Given 12/17/16 0935)  HYDROmorphone (DILAUDID) injection 0.5 mg (0.5 mg Intravenous Given 12/17/16 0936)  sodium chloride 0.9 % bolus 1,000 mL (0 mLs Intravenous Stopped 12/17/16 1055)  ketorolac (TORADOL) 30 MG/ML injection 15 mg (15 mg Intravenous Given 12/17/16 1027)     Initial Impression / Assessment and Plan / ED Course  I have reviewed the triage vital signs and the nursing notes.  Pertinent labs & imaging results that were available during my care of the patient were reviewed by me and considered in my medical decision making (see chart for details).     83 year old female who presents with right flank pain radiating to the right lower quadrant. She is nontoxic and in no acute distress. She is afebrile and hemodynamically stable. Has a soft and benign abdomen but with right lower quadrant and right CVA tenderness. Differential does include urolithiasis, pyelonephritis, and less likely colitis or obstruction.  CT renal stone study was performed. She is evidence of mild hydronephrosis, and what appears to be recently passed kidney stones into the bladder. There is some inflammation around the kidney that could be suggestive of pyelonephritis. UA with some blood, as well as some wbc's and bacteria. Will also treat for pyelonephritis, but given that she has passed her stones into the bladder, she does not require intervention.she does have a leukocytosis of 14. Baseline renal function.  She is felt appropriate for outpatient treatment. We'll discharge home with 10 day course of Keflex. Strict return and follow-up instructions reviewed. She expressed understanding of all discharge instructions and felt comfortable with the plan of care.  Final Clinical Impressions(s) / ED Diagnoses   Final diagnoses:  Nephrolithiasis  Pyelonephritis    New  Prescriptions New Prescriptions   CEPHALEXIN (KEFLEX) 500 MG CAPSULE    Take 1 capsule (500 mg total) by mouth 4 (four) times daily.     Forde Dandy, MD 12/17/16 1106

## 2016-12-17 NOTE — ED Notes (Signed)
Pt returned from CT °

## 2016-12-17 NOTE — ED Notes (Signed)
Advised to call a ride for discharge

## 2016-12-17 NOTE — Discharge Instructions (Signed)
You appear to have kidney stones that have recently passed into the bladder. Your pain should be slowly getting better You also may have a kidney infection on the right side. Take antibiotics as prescribed. Return for worsening symptoms, including fever, intractable vomiting, escalating pain or any other symptoms concerning to you.

## 2016-12-17 NOTE — ED Notes (Signed)
Patient transported to CT 

## 2016-12-19 LAB — URINE CULTURE: Culture: <10000 — AB

## 2016-12-22 ENCOUNTER — Ambulatory Visit
Admission: RE | Admit: 2016-12-22 | Discharge: 2016-12-22 | Disposition: A | Discharge status: Home / Self Care | Payer: Medicare Other | Source: Ambulatory Visit | Attending: Obstetrics & Gynecology | Admitting: Obstetrics & Gynecology

## 2016-12-22 DIAGNOSIS — R928 Other abnormal and inconclusive findings on diagnostic imaging of breast: Secondary | ICD-10-CM

## 2016-12-22 DIAGNOSIS — N6489 Other specified disorders of breast: Secondary | ICD-10-CM | POA: Diagnosis not present

## 2017-01-06 DIAGNOSIS — N814 Uterovaginal prolapse, unspecified: Secondary | ICD-10-CM | POA: Diagnosis not present

## 2017-02-04 DIAGNOSIS — J209 Acute bronchitis, unspecified: Secondary | ICD-10-CM | POA: Diagnosis not present

## 2017-02-05 DIAGNOSIS — Z8673 Personal history of transient ischemic attack (TIA), and cerebral infarction without residual deficits: Secondary | ICD-10-CM | POA: Diagnosis not present

## 2017-02-05 DIAGNOSIS — R0781 Pleurodynia: Secondary | ICD-10-CM | POA: Diagnosis not present

## 2017-02-05 DIAGNOSIS — S20212A Contusion of left front wall of thorax, initial encounter: Secondary | ICD-10-CM | POA: Diagnosis not present

## 2017-02-05 DIAGNOSIS — X500XXA Overexertion from strenuous movement or load, initial encounter: Secondary | ICD-10-CM | POA: Diagnosis not present

## 2017-02-18 DIAGNOSIS — I700 Atherosclerosis of aorta: Secondary | ICD-10-CM | POA: Diagnosis not present

## 2017-02-18 DIAGNOSIS — R05 Cough: Secondary | ICD-10-CM | POA: Diagnosis not present

## 2017-02-18 DIAGNOSIS — Z23 Encounter for immunization: Secondary | ICD-10-CM | POA: Diagnosis not present

## 2017-03-04 DIAGNOSIS — X32XXXD Exposure to sunlight, subsequent encounter: Secondary | ICD-10-CM | POA: Diagnosis not present

## 2017-03-04 DIAGNOSIS — L570 Actinic keratosis: Secondary | ICD-10-CM | POA: Diagnosis not present

## 2017-04-06 DIAGNOSIS — F411 Generalized anxiety disorder: Secondary | ICD-10-CM | POA: Diagnosis not present

## 2017-04-06 DIAGNOSIS — G459 Transient cerebral ischemic attack, unspecified: Secondary | ICD-10-CM | POA: Diagnosis not present

## 2017-04-06 DIAGNOSIS — Z23 Encounter for immunization: Secondary | ICD-10-CM | POA: Diagnosis not present

## 2017-04-06 DIAGNOSIS — J209 Acute bronchitis, unspecified: Secondary | ICD-10-CM | POA: Diagnosis not present

## 2017-04-06 DIAGNOSIS — I10 Essential (primary) hypertension: Secondary | ICD-10-CM | POA: Diagnosis not present

## 2017-04-21 DIAGNOSIS — N814 Uterovaginal prolapse, unspecified: Secondary | ICD-10-CM | POA: Diagnosis not present

## 2017-07-12 DIAGNOSIS — X32XXXD Exposure to sunlight, subsequent encounter: Secondary | ICD-10-CM | POA: Diagnosis not present

## 2017-07-12 DIAGNOSIS — L570 Actinic keratosis: Secondary | ICD-10-CM | POA: Diagnosis not present

## 2017-07-12 DIAGNOSIS — L820 Inflamed seborrheic keratosis: Secondary | ICD-10-CM | POA: Diagnosis not present

## 2017-07-12 DIAGNOSIS — D225 Melanocytic nevi of trunk: Secondary | ICD-10-CM | POA: Diagnosis not present

## 2017-09-15 ENCOUNTER — Ambulatory Visit (INDEPENDENT_AMBULATORY_CARE_PROVIDER_SITE_OTHER): Payer: Medicare Other

## 2017-09-15 ENCOUNTER — Encounter: Payer: Self-pay | Admitting: Orthopaedic Surgery

## 2017-09-15 ENCOUNTER — Ambulatory Visit (INDEPENDENT_AMBULATORY_CARE_PROVIDER_SITE_OTHER): Payer: Medicare Other | Admitting: Orthopaedic Surgery

## 2017-09-15 VITALS — BP 125/70 | HR 71 | Ht 65.0 in | Wt 119.0 lb

## 2017-09-15 DIAGNOSIS — M25572 Pain in left ankle and joints of left foot: Secondary | ICD-10-CM | POA: Diagnosis not present

## 2017-09-15 DIAGNOSIS — M67472 Ganglion, left ankle and foot: Secondary | ICD-10-CM | POA: Diagnosis not present

## 2017-09-15 DIAGNOSIS — M25561 Pain in right knee: Secondary | ICD-10-CM

## 2017-09-15 DIAGNOSIS — G8929 Other chronic pain: Secondary | ICD-10-CM

## 2017-09-15 NOTE — Progress Notes (Signed)
Subjective:    Patient ID: Rebecca Cordova, female    DOB: June 13, 1932, 84 y.o.   MRN: [id number]  HPI She has had pain of the right knee for many months. She has pain medially.  She has popping and occasional swelling.  She has no trauma, no redness, no giving way.  She cannot take NSAIDs.  She takes Tylenol and uses ice to the knee which help.  She has also pain of the left dorsal foot with swelling and a "knot" that is getting bigger. She has no trauma.  The knot prevents shoe wear. She has no trauma, no redness.  Nothing seems to help it.   Review of Systems  Constitutional: Positive for activity change.  Respiratory: Negative for cough and shortness of breath.   Cardiovascular: Negative for chest pain and leg swelling.  Endocrine: Positive for cold intolerance.  Musculoskeletal: Positive for arthralgias, gait problem and joint swelling.  Allergic/Immunologic: Positive for environmental allergies.   Past Medical History:  Diagnosis Date  . Arthritis   . Osteoporosis     Past Surgical History:  Procedure Laterality Date  . APPENDECTOMY    . CHOLECYSTECTOMY    . EYE SURGERY    . OVARIAN CYST REMOVAL    . TONSILLECTOMY      Current Outpatient Medications on File Prior to Visit  Medication Sig Dispense Refill  . acetaminophen (TYLENOL) 500 MG tablet Take 1,000 mg by mouth every 6 (six) hours as needed. Pain    . aspirin EC 81 MG tablet Take 81 mg by mouth daily.    Marland Kitchen lisinopril (PRINIVIL,ZESTRIL) 10 MG tablet Take 10 mg by mouth daily.    . Multiple Vitamin (MULTIVITAMIN WITH MINERALS) TABS Take 1 tablet by mouth daily.    Marland Kitchen tetrahydrozoline-zinc (VISINE-AC) 0.05-0.25 % ophthalmic solution Place 2 drops into both eyes 3 (three) times daily as needed. Itchy/Red Eyes     No current facility-administered medications on file prior to visit.     Social History   Socioeconomic History  . Marital status: Divorced    Spouse name: Not on file  . Number of children: Not on file    . Years of education: Not on file  . Highest education level: Not on file  Occupational History  . Not on file  Social Needs  . Financial resource strain: Not on file  . Food insecurity:    Worry: Not on file    Inability: Not on file  . Transportation needs:    Medical: Not on file    Non-medical: Not on file  Tobacco Use  . Smoking status: Former Smoker    Years: 20.00    Types: Cigarettes    Last attempt to quit: 12/21/2006    Years since quitting: 10.7  . Smokeless tobacco: Never Used  Substance and Sexual Activity  . Alcohol use: No  . Drug use: No  . Sexual activity: Not on file  Lifestyle  . Physical activity:    Days per week: Not on file    Minutes per session: Not on file  . Stress: Not on file  Relationships  . Social connections:    Talks on phone: Not on file    Gets together: Not on file    Attends religious service: Not on file    Active member of club or organization: Not on file    Attends meetings of clubs or organizations: Not on file    Relationship status: Not on file  .  Intimate partner violence:    Fear of current or ex partner: Not on file    Emotionally abused: Not on file    Physically abused: Not on file    Forced sexual activity: Not on file  Other Topics Concern  . Not on file  Social History Narrative  . Not on file    Family History  Problem Relation Age of Onset  . Diabetes Sister   . Diabetes Brother     BP 125/70   Pulse 71   Ht 5\' 5"  (1.651 m)   Wt 119 lb (54 kg)   BMI 19.80 kg/m      Objective:   Physical Exam  Constitutional: She is oriented to person, place, and time. She appears well-developed and well-nourished.  HENT:  Head: Normocephalic and atraumatic.  Eyes: Pupils are equal, round, and reactive to light. Conjunctivae and EOM are normal.  Neck: Normal range of motion. Neck supple.  Cardiovascular: Normal rate, regular rhythm and intact distal pulses.  Pulmonary/Chest: Effort normal.  Abdominal: Soft.   Musculoskeletal:       Right knee: She exhibits decreased range of motion. Tenderness found. Medial joint line tenderness noted.       Legs:      Left foot: There is swelling.       Feet:  Neurological: She is alert and oriented to person, place, and time. She has normal reflexes. She displays normal reflexes. No cranial nerve deficit. She exhibits normal muscle tone. Coordination normal.  Skin: Skin is warm and dry.  Psychiatric: She has a normal mood and affect. Her behavior is normal. Judgment and thought content normal.     X-rays of the left foot and right knee were done, reported separately.     Assessment & Plan:   Encounter Diagnoses  Name Primary?  . Chronic pain of right knee Yes  . Pain of joint of left ankle and foot   . Ganglion cyst of left foot    Procedure note: After permission from the patient and sterile prep, the ganglion of the dorsal left foot was aspirated of about 1 to 1 1/2 cc of clear gelatinous fluid by sterile technique tolerated well.  She declines injection of the right knee.  I have explained the findings.  She has bossing from the bone on the dorsum of the left foot that may need to be excised.  Time will tell.  Her knee has degenerative changes.  She will take Tylenol.  She does not want to consider any surgery or other treatment on the knee at this time.  Return in two weeks.  Call if any problem.  Precautions discussed.   Electronically Signed Sanjuana Kava, MD 5/29/20199:59 AM

## 2017-09-28 DIAGNOSIS — F419 Anxiety disorder, unspecified: Secondary | ICD-10-CM | POA: Diagnosis not present

## 2017-09-28 DIAGNOSIS — G459 Transient cerebral ischemic attack, unspecified: Secondary | ICD-10-CM | POA: Diagnosis not present

## 2017-09-28 DIAGNOSIS — Z79899 Other long term (current) drug therapy: Secondary | ICD-10-CM | POA: Diagnosis not present

## 2017-09-29 ENCOUNTER — Ambulatory Visit: Payer: Medicare Other | Admitting: Orthopaedic Surgery

## 2017-10-05 DIAGNOSIS — Z6821 Body mass index (BMI) 21.0-21.9, adult: Secondary | ICD-10-CM | POA: Diagnosis not present

## 2017-10-05 DIAGNOSIS — G459 Transient cerebral ischemic attack, unspecified: Secondary | ICD-10-CM | POA: Diagnosis not present

## 2017-10-05 DIAGNOSIS — I700 Atherosclerosis of aorta: Secondary | ICD-10-CM | POA: Diagnosis not present

## 2017-11-04 DIAGNOSIS — N814 Uterovaginal prolapse, unspecified: Secondary | ICD-10-CM | POA: Diagnosis not present

## 2018-01-25 DIAGNOSIS — Z23 Encounter for immunization: Secondary | ICD-10-CM | POA: Diagnosis not present

## 2018-03-01 DIAGNOSIS — Z4689 Encounter for fitting and adjustment of other specified devices: Secondary | ICD-10-CM | POA: Diagnosis not present

## 2018-04-07 DIAGNOSIS — F419 Anxiety disorder, unspecified: Secondary | ICD-10-CM | POA: Diagnosis not present

## 2018-04-07 DIAGNOSIS — Z8673 Personal history of transient ischemic attack (TIA), and cerebral infarction without residual deficits: Secondary | ICD-10-CM | POA: Diagnosis not present

## 2018-06-27 IMAGING — US ULTRASOUND LEFT BREAST LIMITED
1 series · 2 of 2 positions shown · non-contrast
Comparison: Previous exam(s).

CLINICAL DATA: Screening recall for 2 left breast asymmetries.

EXAM:
2D DIGITAL DIAGNOSTIC UNILATERAL LEFT MAMMOGRAM WITH CAD AND ADJUNCT
TOMO
LEFT BREAST ULTRASOUND

[Series 1: ultrasound left breast limited · 0.06mm/px · 2 of 2 slices shown]
[im 1/2]
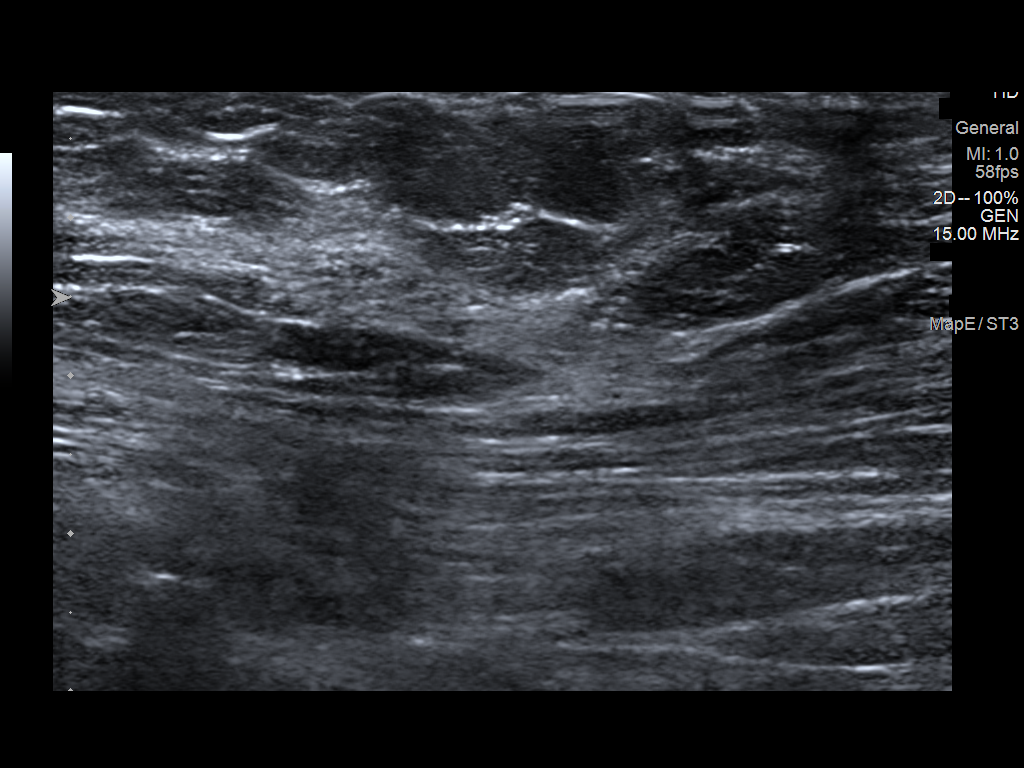
[im 2/2]
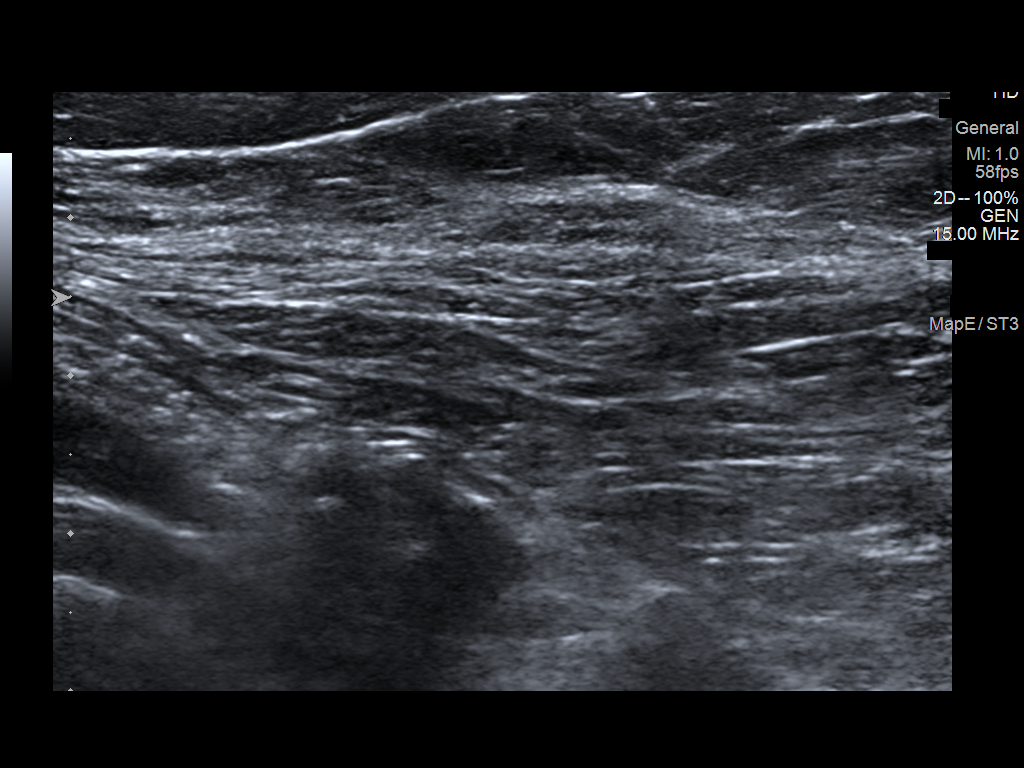

[2 of 2 positions shown; findings below may reference images not displayed]

ACR Breast Density Category b: There are scattered areas of
fibroglandular density.
FINDINGS: The asymmetries of concern in the upper-outer quadrant of the left
breast are less apparent on the tomosynthesis images.

Mammographic images were processed with CAD.

Physical exam of the upper-outer quadrant of the left breast
demonstrates no discrete palpable masses.

Ultrasound targeted to the upper-outer and lateral aspect of the
left breast demonstrates normal fibroglandular tissue. No masses or
suspicious areas of shadowing are identified.
IMPRESSION: There are no persistent suspicious mammographic or targeted
sonographic abnormalities in the lateral or upper-outer left breast.

RECOMMENDATION:
Screening mammogram in one year.(Code:KZ-[id number])

I have discussed the findings and recommendations with the patient.
Results were also provided in writing at the conclusion of the
visit. If applicable, a reminder letter will be sent to the patient
regarding the next appointment.

BI-RADS CATEGORY  1: Negative.

## 2018-10-04 DIAGNOSIS — F419 Anxiety disorder, unspecified: Secondary | ICD-10-CM | POA: Diagnosis not present

## 2018-10-04 DIAGNOSIS — Z8673 Personal history of transient ischemic attack (TIA), and cerebral infarction without residual deficits: Secondary | ICD-10-CM | POA: Diagnosis not present

## 2018-10-04 DIAGNOSIS — Z79899 Other long term (current) drug therapy: Secondary | ICD-10-CM | POA: Diagnosis not present

## 2018-10-11 DIAGNOSIS — Z8673 Personal history of transient ischemic attack (TIA), and cerebral infarction without residual deficits: Secondary | ICD-10-CM | POA: Diagnosis not present

## 2018-10-11 DIAGNOSIS — F419 Anxiety disorder, unspecified: Secondary | ICD-10-CM | POA: Diagnosis not present

## 2018-12-12 DIAGNOSIS — Z681 Body mass index (BMI) 19 or less, adult: Secondary | ICD-10-CM | POA: Diagnosis not present

## 2018-12-12 DIAGNOSIS — Z01419 Encounter for gynecological examination (general) (routine) without abnormal findings: Secondary | ICD-10-CM | POA: Diagnosis not present

## 2018-12-12 DIAGNOSIS — Z1231 Encounter for screening mammogram for malignant neoplasm of breast: Secondary | ICD-10-CM | POA: Diagnosis not present

## 2019-01-03 DIAGNOSIS — Z4689 Encounter for fitting and adjustment of other specified devices: Secondary | ICD-10-CM | POA: Diagnosis not present

## 2019-02-09 DIAGNOSIS — Z23 Encounter for immunization: Secondary | ICD-10-CM | POA: Diagnosis not present

## 2019-06-01 DIAGNOSIS — R2232 Localized swelling, mass and lump, left upper limb: Secondary | ICD-10-CM | POA: Diagnosis not present

## 2019-06-01 DIAGNOSIS — D0439 Carcinoma in situ of skin of other parts of face: Secondary | ICD-10-CM | POA: Diagnosis not present

## 2019-06-01 DIAGNOSIS — L218 Other seborrheic dermatitis: Secondary | ICD-10-CM | POA: Diagnosis not present

## 2019-06-01 DIAGNOSIS — L648 Other androgenic alopecia: Secondary | ICD-10-CM | POA: Diagnosis not present

## 2019-06-23 ENCOUNTER — Ambulatory Visit: Payer: Medicare Other | Attending: Internal Medicine

## 2019-06-23 DIAGNOSIS — Z23 Encounter for immunization: Secondary | ICD-10-CM | POA: Insufficient documentation

## 2019-06-23 NOTE — Progress Notes (Signed)
   Covid-19 Vaccination Clinic  Name:  DOROTA KEAVENEY    MRN: [id number] DOB: [birth year]  06/23/2019  Ms. Peabody was observed post Covid-19 immunization for 15 minutes without incident. She was provided with Vaccine Information Sheet and instruction to access the V-Safe system.   Ms. Dean was instructed to call 911 with any severe reactions post vaccine: Marland Kitchen Difficulty breathing  . Swelling of face and throat  . A fast heartbeat  . A bad rash all over body  . Dizziness and weakness   Immunizations Administered    Name Date Dose VIS Date Route   Moderna COVID-19 Vaccine 06/23/2019 11:24 AM 0.5 mL 03/21/2019 Intramuscular   Manufacturer: Moderna   Lot: [id number]   [id number]

## 2019-07-26 ENCOUNTER — Ambulatory Visit: Payer: Medicare Other | Attending: Internal Medicine

## 2019-07-26 DIAGNOSIS — Z23 Encounter for immunization: Secondary | ICD-10-CM

## 2019-07-26 NOTE — Progress Notes (Signed)
   Covid-19 Vaccination Clinic  Name:  Rebecca Cordova    MRN: [id number] DOB: 08-21-1932  07/26/2019  [name] was observed post Covid-19 immunization for 15 minutes without incident. She was provided with Vaccine Information Sheet and instruction to access the V-Safe system.   [name] was instructed to call 911 with any severe reactions post vaccine: Marland Kitchen Difficulty breathing  . Swelling of face and throat  . A fast heartbeat  . A bad rash all over body  . Dizziness and weakness   Immunizations Administered    Name Date Dose VIS Date Route   Moderna COVID-19 Vaccine 07/26/2019 10:37 AM 0.5 mL 03/21/2019 Intramuscular   Manufacturer: Levan Hurst   [id number]   [id number]

## 2019-10-03 DIAGNOSIS — Z79899 Other long term (current) drug therapy: Secondary | ICD-10-CM | POA: Diagnosis not present

## 2019-10-03 DIAGNOSIS — F419 Anxiety disorder, unspecified: Secondary | ICD-10-CM | POA: Diagnosis not present

## 2019-10-03 DIAGNOSIS — G459 Transient cerebral ischemic attack, unspecified: Secondary | ICD-10-CM | POA: Diagnosis not present

## 2019-11-23 DIAGNOSIS — Z08 Encounter for follow-up examination after completed treatment for malignant neoplasm: Secondary | ICD-10-CM | POA: Diagnosis not present

## 2019-11-23 DIAGNOSIS — L570 Actinic keratosis: Secondary | ICD-10-CM | POA: Diagnosis not present

## 2019-11-23 DIAGNOSIS — X32XXXD Exposure to sunlight, subsequent encounter: Secondary | ICD-10-CM | POA: Diagnosis not present

## 2019-11-23 DIAGNOSIS — Z85828 Personal history of other malignant neoplasm of skin: Secondary | ICD-10-CM | POA: Diagnosis not present

## 2019-12-26 DIAGNOSIS — Z1231 Encounter for screening mammogram for malignant neoplasm of breast: Secondary | ICD-10-CM | POA: Diagnosis not present

## 2019-12-26 DIAGNOSIS — Z4689 Encounter for fitting and adjustment of other specified devices: Secondary | ICD-10-CM | POA: Diagnosis not present

## 2019-12-28 ENCOUNTER — Other Ambulatory Visit: Payer: Self-pay | Admitting: Obstetrics & Gynecology

## 2019-12-28 DIAGNOSIS — R928 Other abnormal and inconclusive findings on diagnostic imaging of breast: Secondary | ICD-10-CM

## 2020-01-03 ENCOUNTER — Ambulatory Visit
Admission: RE | Admit: 2020-01-03 | Discharge: 2020-01-03 | Disposition: A | Discharge status: Home / Self Care | Payer: Medicare Other | Source: Ambulatory Visit | Attending: Obstetrics & Gynecology | Admitting: Obstetrics & Gynecology

## 2020-01-03 ENCOUNTER — Ambulatory Visit: Payer: Medicare Other

## 2020-01-03 ENCOUNTER — Other Ambulatory Visit: Payer: Self-pay

## 2020-01-03 DIAGNOSIS — R928 Other abnormal and inconclusive findings on diagnostic imaging of breast: Secondary | ICD-10-CM

## 2020-02-09 DIAGNOSIS — Z23 Encounter for immunization: Secondary | ICD-10-CM | POA: Diagnosis not present

## 2020-04-08 DIAGNOSIS — Z23 Encounter for immunization: Secondary | ICD-10-CM | POA: Diagnosis not present

## 2020-09-13 DIAGNOSIS — M1990 Unspecified osteoarthritis, unspecified site: Secondary | ICD-10-CM | POA: Diagnosis not present

## 2020-09-13 DIAGNOSIS — Z79899 Other long term (current) drug therapy: Secondary | ICD-10-CM | POA: Diagnosis not present

## 2020-09-13 DIAGNOSIS — F419 Anxiety disorder, unspecified: Secondary | ICD-10-CM | POA: Diagnosis not present

## 2020-09-13 DIAGNOSIS — Z8673 Personal history of transient ischemic attack (TIA), and cerebral infarction without residual deficits: Secondary | ICD-10-CM | POA: Diagnosis not present

## 2020-09-20 DIAGNOSIS — Z8673 Personal history of transient ischemic attack (TIA), and cerebral infarction without residual deficits: Secondary | ICD-10-CM | POA: Diagnosis not present

## 2020-09-20 DIAGNOSIS — E785 Hyperlipidemia, unspecified: Secondary | ICD-10-CM | POA: Diagnosis not present

## 2020-10-28 DIAGNOSIS — S91001A Unspecified open wound, right ankle, initial encounter: Secondary | ICD-10-CM | POA: Diagnosis not present

## 2020-10-28 DIAGNOSIS — M25561 Pain in right knee: Secondary | ICD-10-CM | POA: Diagnosis not present

## 2021-01-30 DIAGNOSIS — Z681 Body mass index (BMI) 19 or less, adult: Secondary | ICD-10-CM | POA: Diagnosis not present

## 2021-01-30 DIAGNOSIS — Z01419 Encounter for gynecological examination (general) (routine) without abnormal findings: Secondary | ICD-10-CM | POA: Diagnosis not present

## 2021-01-30 DIAGNOSIS — Z1231 Encounter for screening mammogram for malignant neoplasm of breast: Secondary | ICD-10-CM | POA: Diagnosis not present

## 2021-02-03 DIAGNOSIS — B370 Candidal stomatitis: Secondary | ICD-10-CM | POA: Diagnosis not present

## 2021-02-03 DIAGNOSIS — B3781 Candidal esophagitis: Secondary | ICD-10-CM | POA: Diagnosis not present

## 2021-02-26 DIAGNOSIS — Z23 Encounter for immunization: Secondary | ICD-10-CM | POA: Diagnosis not present

## 2021-03-24 DIAGNOSIS — F419 Anxiety disorder, unspecified: Secondary | ICD-10-CM | POA: Diagnosis not present

## 2021-03-24 DIAGNOSIS — Z8673 Personal history of transient ischemic attack (TIA), and cerebral infarction without residual deficits: Secondary | ICD-10-CM | POA: Diagnosis not present

## 2021-06-23 DIAGNOSIS — B029 Zoster without complications: Secondary | ICD-10-CM | POA: Diagnosis not present

## 2021-07-17 DIAGNOSIS — M069 Rheumatoid arthritis, unspecified: Secondary | ICD-10-CM | POA: Diagnosis not present

## 2021-07-17 DIAGNOSIS — M13131 Monoarthritis, not elsewhere classified, right wrist: Secondary | ICD-10-CM | POA: Diagnosis not present

## 2021-08-29 DIAGNOSIS — Z4689 Encounter for fitting and adjustment of other specified devices: Secondary | ICD-10-CM | POA: Diagnosis not present

## 2021-09-24 DIAGNOSIS — Z8673 Personal history of transient ischemic attack (TIA), and cerebral infarction without residual deficits: Secondary | ICD-10-CM | POA: Diagnosis not present

## 2021-09-24 DIAGNOSIS — Z79899 Other long term (current) drug therapy: Secondary | ICD-10-CM | POA: Diagnosis not present

## 2021-09-24 DIAGNOSIS — E785 Hyperlipidemia, unspecified: Secondary | ICD-10-CM | POA: Diagnosis not present

## 2021-09-24 DIAGNOSIS — F419 Anxiety disorder, unspecified: Secondary | ICD-10-CM | POA: Diagnosis not present

## 2021-10-02 DIAGNOSIS — F411 Generalized anxiety disorder: Secondary | ICD-10-CM | POA: Diagnosis not present

## 2021-10-02 DIAGNOSIS — R051 Acute cough: Secondary | ICD-10-CM | POA: Diagnosis not present

## 2021-10-02 DIAGNOSIS — Z8673 Personal history of transient ischemic attack (TIA), and cerebral infarction without residual deficits: Secondary | ICD-10-CM | POA: Diagnosis not present

## 2022-01-05 DIAGNOSIS — H1032 Unspecified acute conjunctivitis, left eye: Secondary | ICD-10-CM | POA: Diagnosis not present

## 2022-01-05 DIAGNOSIS — B9689 Other specified bacterial agents as the cause of diseases classified elsewhere: Secondary | ICD-10-CM | POA: Diagnosis not present

## 2022-01-05 DIAGNOSIS — Z880 Allergy status to penicillin: Secondary | ICD-10-CM | POA: Diagnosis not present

## 2022-01-05 DIAGNOSIS — H10022 Other mucopurulent conjunctivitis, left eye: Secondary | ICD-10-CM | POA: Diagnosis not present

## 2022-01-05 DIAGNOSIS — H5789 Other specified disorders of eye and adnexa: Secondary | ICD-10-CM | POA: Diagnosis not present

## 2022-01-12 ENCOUNTER — Emergency Department (HOSPITAL_COMMUNITY)
Admission: EM | Admit: 2022-01-12 | Discharge: 2022-01-12 | Disposition: A | Discharge status: Home / Self Care | Payer: Medicare Other | Attending: Student | Admitting: Student

## 2022-01-12 ENCOUNTER — Encounter (HOSPITAL_COMMUNITY): Payer: Self-pay | Admitting: Emergency Medicine

## 2022-01-12 ENCOUNTER — Other Ambulatory Visit: Payer: Self-pay

## 2022-01-12 DIAGNOSIS — Y9301 Activity, walking, marching and hiking: Secondary | ICD-10-CM | POA: Insufficient documentation

## 2022-01-12 DIAGNOSIS — H1089 Other conjunctivitis: Secondary | ICD-10-CM | POA: Insufficient documentation

## 2022-01-12 DIAGNOSIS — Y9289 Other specified places as the place of occurrence of the external cause: Secondary | ICD-10-CM | POA: Insufficient documentation

## 2022-01-12 DIAGNOSIS — Z79899 Other long term (current) drug therapy: Secondary | ICD-10-CM | POA: Insufficient documentation

## 2022-01-12 DIAGNOSIS — B9689 Other specified bacterial agents as the cause of diseases classified elsewhere: Secondary | ICD-10-CM | POA: Insufficient documentation

## 2022-01-12 DIAGNOSIS — H1032 Unspecified acute conjunctivitis, left eye: Secondary | ICD-10-CM | POA: Diagnosis not present

## 2022-01-12 DIAGNOSIS — H109 Unspecified conjunctivitis: Secondary | ICD-10-CM

## 2022-01-12 DIAGNOSIS — S50312A Abrasion of left elbow, initial encounter: Secondary | ICD-10-CM | POA: Insufficient documentation

## 2022-01-12 DIAGNOSIS — W1839XA Other fall on same level, initial encounter: Secondary | ICD-10-CM | POA: Insufficient documentation

## 2022-01-12 DIAGNOSIS — M25512 Pain in left shoulder: Secondary | ICD-10-CM | POA: Insufficient documentation

## 2022-01-12 DIAGNOSIS — Z7982 Long term (current) use of aspirin: Secondary | ICD-10-CM | POA: Insufficient documentation

## 2022-01-12 DIAGNOSIS — S59902A Unspecified injury of left elbow, initial encounter: Secondary | ICD-10-CM | POA: Diagnosis present

## 2022-01-12 MED ORDER — ERYTHROMYCIN 5 MG/GM OP OINT: TOPICAL_OINTMENT | OPHTHALMIC | 0 refills | Status: DC

## 2022-01-12 MED ORDER — TETRACAINE HCL 0.5 % OP SOLN
2.0000 [drp] | Freq: Once | OPHTHALMIC | Status: AC
  Administered 2022-01-12: 2 [drp] via OPHTHALMIC
  Filled 2022-01-12: qty 4

## 2022-01-12 MED ORDER — FLUORESCEIN SODIUM 1 MG OP STRP
1.0000 | ORAL_STRIP | Freq: Once | OPHTHALMIC | Status: AC
  Administered 2022-01-12: 1 via OPHTHALMIC
  Filled 2022-01-12: qty 1

## 2022-01-12 NOTE — Discharge Instructions (Addendum)
Please apply the antibiotic eye ointment every 6 hours and apply warm compresses. Continue eye ointment for 7 days Follow up with an ophthalmologist.   Please also follow up with your primary care doctor.

## 2022-01-12 NOTE — ED Provider Notes (Signed)
[facility] EMERGENCY DEPARTMENT Provider Note   CSN: [id number] Arrival date & time: 01/12/22  1148     History  Chief Complaint  Patient presents with   Rebecca Cordova is a 88 y.o. female.   Fall  Patient is an 88 year old female with past medical history of arthritis, osteoporosis and has had a prior eye surgery.   Presents today primarily for evaluation of left eye redness for 3 weeks. Patient has been having eye redness and irritation for 3 weeks. No known cause or injury, denies foreign body in eye. Not around any others with eye irritation. Has tried visine without relief. Redness will come and go but never fully resolve. No notable pattern or triggers for redness. Left eye is mildly itchy and slightly tender but not concerning per patient. Left eye vision is no worse than baseline and has been declining for years. Right eye is unaffected. Takes a baby aspirin most days. Not on any other blood thinners. Does not smoke or drink alcohol.   Upon arrival to the ED, patient parked far away at a doctor's office down the street and fell while ambulating from her car. Patient fell backwards when trying to step over a concrete area. Landed on back and left shoulder primarily. Has an abrasion on left elbow and endorses hitting head when she fell. Denies any LOC, headache or bleeding from head. No neck stiffness or tenderness. Most concerned about left shoulder pain, has pain when moving shoulder around but does not feel like it is broken. Endorses some pain over superficial abrasion on left elbow.      Home Medications Prior to Admission medications   Medication Sig Start Date End Date Taking? Authorizing Provider  acetaminophen (TYLENOL) 500 MG tablet Take 1,000 mg by mouth every 6 (six) hours as needed. Pain    [provider]  aspirin EC 81 MG tablet Take 81 mg by mouth daily.    [provider]  lisinopril (PRINIVIL,ZESTRIL) 10 MG tablet Take 10 mg by mouth  daily.    [provider]  Multiple Vitamin (MULTIVITAMIN WITH MINERALS) TABS Take 1 tablet by mouth daily.    [provider]  tetrahydrozoline-zinc (VISINE-AC) 0.05-0.25 % ophthalmic solution Place 2 drops into both eyes 3 (three) times daily as needed. Itchy/Red Eyes    [provider]      Allergies    Penicillins    Review of Systems   Review of Systems  Physical Exam Updated Vital Signs BP 122/72   Pulse 65   Temp 99.1 F (37.3 C) (Oral)   Resp 17   Ht '5\' 5"'$  (1.651 m)   Wt 54 kg   SpO2 100%   BMI 19.81 kg/m  Physical Exam Vitals and nursing note reviewed.  Constitutional:      General: She is not in acute distress.    Appearance: Normal appearance. She is not ill-appearing.  HENT:     Head: Normocephalic and atraumatic.  Eyes:     General: No scleral icterus.       Right eye: No discharge.        Left eye: Discharge present.    Conjunctiva/sclera: Conjunctivae normal.     Comments: Conjunctivitis of left eye.  No corneal abrasion/fluorescein uptake on exam. EOMI  Pulmonary:     Effort: Pulmonary effort is normal.     Breath sounds: No stridor.  Musculoskeletal:     Right lower leg: No edema.  Left lower leg: No edema.     Comments: No bony tenderness over joints or long bones of the upper and lower extremities.    No neck or back midline tenderness, step-off, deformity, or bruising. Able to turn head left and right 45 degrees without difficulty.  Full range of motion of upper and lower extremity joints shown after palpation was conducted; with 5/5 symmetrical strength in upper and lower extremities. No chest wall tenderness, no facial or cranial tenderness.   Patient has intact sensation grossly in lower and upper extremities. Intact patellar and ankle reflexes. Patient able to ambulate without difficulty.  Radial and DP pulses palpated BL.    Neurological:     Mental Status: She is alert and oriented to person, place, and  time. Mental status is at baseline.     ED Results / Procedures / Treatments   Labs (all labs ordered are listed, but only abnormal results are displayed) Labs Reviewed - No data to display  EKG None  Radiology No results found.  Procedures Procedures    Medications Ordered in ED Medications  fluorescein ophthalmic strip 1 strip (has no administration in time range)  tetracaine (PONTOCAINE) 0.5 % ophthalmic solution 2 drop (has no administration in time range)    ED Course/ Medical Decision Making/ A&P                           Medical Decision Making Risk Prescription drug management.   Patient here with 1 week of left eye conjunctivitis. She also had a minor fall when she was walking from the parking area to the hospital to have her eyes evaluated.  She states that she bumped her head while she was walking her as a grassy area.  She did not lose consciousness or have any nausea or vomiting she denies any headache.  From a trauma standpoint patient is very well-appearing she has small abrasions over her elbows but no evidence of head injury.  Offered CT evaluation of head given her age is a risk factor however she declines this.  During my evaluation is the patient does speak somewhat redundantly and repeat herself occasionally.  I called patient's significant other Rebecca Cordova who confirmed that this is not abnormal for patient.  Will follow patient's wishes for holding off on any trauma work-up.  She is overall quite well-appearing.  Eye exam unremarkable.  Does have conjunctivitis but no corneal abrasions.  Does not wear contact lenses.  Erythromycin and follow-up with ophthalmology.   Final Clinical Impression(s) / ED Diagnoses Final diagnoses:  Conjunctivitis, bacterial    Rx / DC Orders ED Discharge Orders     None         Tedd Sias, [place] 01/12/22 1651    Teressa Lower, MD 01/13/22 0730

## 2022-01-12 NOTE — ED Triage Notes (Addendum)
Pt to the ED after a fall with pain in her left shoulder. Pt describes hitting her head with no LOC or blood thinners.  Pt also has left eye redness for the past few weeks.

## 2022-02-02 DIAGNOSIS — B0052 Herpesviral keratitis: Secondary | ICD-10-CM | POA: Diagnosis not present

## 2022-02-09 DIAGNOSIS — B0052 Herpesviral keratitis: Secondary | ICD-10-CM | POA: Diagnosis not present

## 2022-02-10 DIAGNOSIS — M25511 Pain in right shoulder: Secondary | ICD-10-CM | POA: Diagnosis not present

## 2022-02-11 DIAGNOSIS — Z01419 Encounter for gynecological examination (general) (routine) without abnormal findings: Secondary | ICD-10-CM | POA: Diagnosis not present

## 2022-02-11 DIAGNOSIS — Z1231 Encounter for screening mammogram for malignant neoplasm of breast: Secondary | ICD-10-CM | POA: Diagnosis not present

## 2022-02-14 ENCOUNTER — Emergency Department (HOSPITAL_COMMUNITY): Payer: Medicare Other

## 2022-02-14 ENCOUNTER — Encounter (HOSPITAL_COMMUNITY): Payer: Self-pay | Admitting: Emergency Medicine

## 2022-02-14 ENCOUNTER — Emergency Department (HOSPITAL_COMMUNITY)
Admission: EM | Admit: 2022-02-14 | Discharge: 2022-02-14 | Disposition: A | Discharge status: Home / Self Care | Payer: Medicare Other | Attending: Emergency Medicine | Admitting: Emergency Medicine

## 2022-02-14 ENCOUNTER — Other Ambulatory Visit: Payer: Self-pay

## 2022-02-14 DIAGNOSIS — L03011 Cellulitis of right finger: Secondary | ICD-10-CM | POA: Diagnosis not present

## 2022-02-14 DIAGNOSIS — M19041 Primary osteoarthritis, right hand: Secondary | ICD-10-CM | POA: Diagnosis not present

## 2022-02-14 DIAGNOSIS — Z7982 Long term (current) use of aspirin: Secondary | ICD-10-CM | POA: Diagnosis not present

## 2022-02-14 DIAGNOSIS — I10 Essential (primary) hypertension: Secondary | ICD-10-CM | POA: Insufficient documentation

## 2022-02-14 DIAGNOSIS — W19XXXA Unspecified fall, initial encounter: Secondary | ICD-10-CM | POA: Diagnosis not present

## 2022-02-14 DIAGNOSIS — Z23 Encounter for immunization: Secondary | ICD-10-CM | POA: Insufficient documentation

## 2022-02-14 DIAGNOSIS — S6991XA Unspecified injury of right wrist, hand and finger(s), initial encounter: Secondary | ICD-10-CM | POA: Diagnosis not present

## 2022-02-14 DIAGNOSIS — Z79899 Other long term (current) drug therapy: Secondary | ICD-10-CM | POA: Diagnosis not present

## 2022-02-14 DIAGNOSIS — S6990XA Unspecified injury of unspecified wrist, hand and finger(s), initial encounter: Secondary | ICD-10-CM

## 2022-02-14 MED ORDER — TETANUS-DIPHTH-ACELL PERTUSSIS 5-2.5-18.5 LF-MCG/0.5 IM SUSY
0.5000 mL | PREFILLED_SYRINGE | Freq: Once | INTRAMUSCULAR | Status: AC
  Administered 2022-02-14: 0.5 mL via INTRAMUSCULAR
  Filled 2022-02-14: qty 0.5

## 2022-02-14 MED ORDER — DOXYCYCLINE HYCLATE 100 MG PO CAPS: 100.0000 mg | ORAL_CAPSULE | Freq: Two times a day (BID) | ORAL | 0 refills | Status: DC

## 2022-02-14 NOTE — Discharge Instructions (Addendum)
Follow-up with your doctor this week for recheck. 

## 2022-02-14 NOTE — ED Provider Notes (Signed)
[facility] EMERGENCY DEPARTMENT Provider Note   CSN: [id number] Arrival date & time: 02/14/22  1555     History {Add pertinent medical, surgical, social history, OB history to HPI:1} Chief Complaint  Patient presents with   Finger Injury    Rebecca Cordova is a 88 y.o. female.  Patient has a history of arthritis and hypertension.  She fell on her hand a few days ago and had a very tiny cut.  Now her right small finger is red and tender   Hand Pain       Home Medications Prior to Admission medications   Medication Sig Start Date End Date Taking? Authorizing Provider  acetaminophen (TYLENOL) 500 MG tablet Take 1,000 mg by mouth every 6 (six) hours as needed. Pain   Yes [provider]  aspirin EC 81 MG tablet Take 81 mg by mouth daily.   Yes [provider]  doxycycline (VIBRAMYCIN) 100 MG capsule Take 1 capsule (100 mg total) by mouth 2 (two) times daily. One po bid x 7 days 02/14/22  Yes Milton Ferguson, MD  lisinopril (PRINIVIL,ZESTRIL) 10 MG tablet Take 10 mg by mouth daily as needed (patient takes when she feels like her blood pressure is up.).   Yes [provider]  Multiple Vitamin (MULTIVITAMIN WITH MINERALS) TABS Take 1 tablet by mouth daily.   Yes [provider]      Allergies    Penicillins    Review of Systems   Review of Systems  Physical Exam Updated Vital Signs BP 115/76   Pulse 83   Temp 98.3 F (36.8 C)   Resp 18   Ht '5\' 6"'$  (1.676 m)   Wt 46.7 kg   SpO2 100%   BMI 16.62 kg/m  Physical Exam  ED Results / Procedures / Treatments   Labs (all labs ordered are listed, but only abnormal results are displayed) Labs Reviewed - No data to display  EKG None  Radiology DG Finger Little Right  Result Date: 02/14/2022 CLINICAL DATA:  Trauma, fall EXAM: RIGHT LITTLE FINGER 2+V COMPARISON:  12/21/2011 FINDINGS: No recent fracture or dislocation is seen. Degenerative changes are noted in interphalangeal joints, more  severe in the distal interphalangeal joint of the middle finger. IMPRESSION: No recent fracture or dislocation is seen right fifth finger. Degenerative changes are noted in multiple interphalangeal joints. Electronically Signed   By: Elmer Picker M.D.   On: 02/14/2022 16:30    Procedures Procedures  {Document cardiac monitor, telemetry assessment procedure when appropriate:1}  Medications Ordered in ED Medications  Tdap (BOOSTRIX) injection 0.5 mL (0.5 mLs Intramuscular Given 02/14/22 1655)    ED Course/ Medical Decision Making/ A&P                           Medical Decision Making Amount and/or Complexity of Data Reviewed Radiology: ordered.  Risk Prescription drug management.   Minor cellulitis to right small finger.  She started on doxycycline and will follow-up with her PCP this week  {Document critical care time when appropriate:1} {Document review of labs and clinical decision tools ie heart score, Chads2Vasc2 etc:1}  {Document your independent review of radiology images, and any outside records:1} {Document your discussion with family members, caretakers, and with consultants:1} {Document social determinants of health affecting pt's care:1} {Document your decision making why or why not admission, treatments were needed:1} Final Clinical Impression(s) / ED Diagnoses Final diagnoses:  Finger injury, unspecified laterality, initial encounter  Rx / DC Orders ED Discharge Orders          Ordered    doxycycline (VIBRAMYCIN) 100 MG capsule  2 times daily        02/14/22 1702

## 2022-02-14 NOTE — ED Triage Notes (Signed)
Patient c/o right pinky laceration x 1 week ago with redness and swelling.

## 2022-02-17 DIAGNOSIS — B0052 Herpesviral keratitis: Secondary | ICD-10-CM | POA: Diagnosis not present

## 2022-02-24 DIAGNOSIS — B0052 Herpesviral keratitis: Secondary | ICD-10-CM | POA: Diagnosis not present

## 2022-03-04 ENCOUNTER — Emergency Department (HOSPITAL_COMMUNITY): Payer: Medicare Other

## 2022-03-04 ENCOUNTER — Encounter (HOSPITAL_COMMUNITY): Payer: Self-pay | Admitting: Emergency Medicine

## 2022-03-04 ENCOUNTER — Emergency Department (HOSPITAL_COMMUNITY)
Admission: EM | Admit: 2022-03-04 | Discharge: 2022-03-04 | Disposition: A | Discharge status: Home / Self Care | Payer: Medicare Other | Attending: Emergency Medicine | Admitting: Emergency Medicine

## 2022-03-04 ENCOUNTER — Other Ambulatory Visit: Payer: Self-pay

## 2022-03-04 DIAGNOSIS — M71172 Other infective bursitis, left ankle and foot: Secondary | ICD-10-CM | POA: Diagnosis not present

## 2022-03-04 DIAGNOSIS — M71572 Other bursitis, not elsewhere classified, left ankle and foot: Secondary | ICD-10-CM | POA: Insufficient documentation

## 2022-03-04 DIAGNOSIS — M25572 Pain in left ankle and joints of left foot: Secondary | ICD-10-CM | POA: Diagnosis present

## 2022-03-04 DIAGNOSIS — M7989 Other specified soft tissue disorders: Secondary | ICD-10-CM | POA: Diagnosis not present

## 2022-03-04 DIAGNOSIS — Z79899 Other long term (current) drug therapy: Secondary | ICD-10-CM | POA: Insufficient documentation

## 2022-03-04 DIAGNOSIS — Z7982 Long term (current) use of aspirin: Secondary | ICD-10-CM | POA: Diagnosis not present

## 2022-03-04 DIAGNOSIS — M7752 Other enthesopathy of left foot: Secondary | ICD-10-CM

## 2022-03-04 MED ORDER — IBUPROFEN 400 MG PO TABS
600.0000 mg | ORAL_TABLET | Freq: Once | ORAL | Status: AC
  Administered 2022-03-04: 600 mg via ORAL
  Filled 2022-03-04: qty 2

## 2022-03-04 NOTE — ED Triage Notes (Signed)
Left great toe pain. Redness noted. Nad. Denies injury. X 2-3 days

## 2022-03-04 NOTE — Discharge Instructions (Signed)
Evaluation for your toe pain revealed that you likely have a bursitis which is inflammation of the bursa above the toe joint.  Saunders Revel is a fluid-filled sac that provides cushion and support to the joints.  At this time does not look infected but it is inflamed which is likely the source of your discomfort.  Recommend that you take ibuprofen as needed for your symptoms.  Also advised that you follow-up with your PCP for further evaluation in the next 3 to 5 days.

## 2022-03-04 NOTE — ED Provider Notes (Signed)
[facility] EMERGENCY DEPARTMENT Provider Note   CSN: [id number] Arrival date & time: 03/04/22  1023     History  Chief Complaint  Patient presents with   Toe Pain   HPI Rebecca Cordova is a 88 y.o. female with arthritis and osteoporosis presenting for toe pain.  Started about 2 weeks ago.  Pain is located in the left great toe.  In the last couple days the toe has been more swollen and red.  Denies warmth.  She is concerned that it is skin infection.  It is painful to touch.  Denies fever.  Endorses normal range of motion and able to bear weight on her foot.  States he did "break" her left foot several years ago.  Denies trauma.    Toe Pain       Home Medications Prior to Admission medications   Medication Sig Start Date End Date Taking? Authorizing Provider  acetaminophen (TYLENOL) 500 MG tablet Take 1,000 mg by mouth every 6 (six) hours as needed. Pain    [provider]  aspirin EC 81 MG tablet Take 81 mg by mouth daily.    [provider]  doxycycline (VIBRAMYCIN) 100 MG capsule Take 1 capsule (100 mg total) by mouth 2 (two) times daily. One po bid x 7 days 02/14/22   Milton Ferguson, MD  lisinopril (PRINIVIL,ZESTRIL) 10 MG tablet Take 10 mg by mouth daily as needed (patient takes when she feels like her blood pressure is up.).    [provider]  Multiple Vitamin (MULTIVITAMIN WITH MINERALS) TABS Take 1 tablet by mouth daily.    [provider]      Allergies    Penicillins    Review of Systems   Review of Systems  Musculoskeletal:        Toe pain    Physical Exam Updated Vital Signs BP (!) 124/92 (BP Location: Right Arm)   Pulse 73   Temp 98.1 F (36.7 C) (Oral)   Resp 18   SpO2 98%  Physical Exam Constitutional:      Appearance: Normal appearance.  HENT:     Head: Normocephalic.     Nose: Nose normal.  Eyes:     Conjunctiva/sclera: Conjunctivae normal.  Pulmonary:     Effort: Pulmonary effort is normal.   Musculoskeletal:     Comments: Left great toes swollen and erythematous but not warm.  Range of motion of her toes are within normal limits.  No tenderness elicited on flexion or extension of the toe.  Cap refill is brisk at the distal tip of the toe with good sensation.  Neurological:     Mental Status: She is alert.  Psychiatric:        Mood and Affect: Mood normal.     ED Results / Procedures / Treatments   Labs (all labs ordered are listed, but only abnormal results are displayed) Labs Reviewed - No data to display  EKG None  Radiology DG Foot Complete Left  Result Date: 03/04/2022 CLINICAL DATA:  Great toe red and swollen. EXAM: LEFT FOOT - COMPLETE 3+ VIEW COMPARISON:  Left foot 09/15/2017 FINDINGS: Marked hallux valgus deformity. Mild degenerative change in the first MTP. No erosion. Mild soft tissue swelling over the distal first metatarsal likely due to bursitis. Negative for fracture.  No other arthropathy. IMPRESSION: Marked hallux valgus deformity. Soft tissue swelling over the distal first metatarsal likely due to bursitis. No acute abnormality. Electronically Signed   By: Franchot Gallo M.D.  On: 03/04/2022 11:54    Procedures Procedures    Medications Ordered in ED Medications  ibuprofen (ADVIL) tablet 600 mg (has no administration in time range)    ED Course/ Medical Decision Making/ A&P                           Medical Decision Making Amount and/or Complexity of Data Reviewed Radiology: ordered.   Patient presented for toe pain.  Located in the left great toe.  Exam revealed that the toes swollen and erythematous especially at the nailbed.  Not tender to touch and not warm.  Concern for osteomyelitis but unlikely given reassuring x-ray.  X-ray did show concerns for likely bursitis of the first metatarsal.  Treated with ibuprofen.  Did consider an infective bursitis but given that the toe is not warm and only mildly painful with touch, did not feel it  warranted antibiotics at this time.  Advised her to follow-up with her PCP.        Final Clinical Impression(s) / ED Diagnoses Final diagnoses:  Bursitis of left foot    Rx / DC Orders ED Discharge Orders     None         Harriet Pho, PA-C 03/04/22 1332    Milton Ferguson, MD 03/06/22 1736

## 2022-03-09 DIAGNOSIS — B0052 Herpesviral keratitis: Secondary | ICD-10-CM | POA: Diagnosis not present

## 2022-03-30 DIAGNOSIS — Z23 Encounter for immunization: Secondary | ICD-10-CM | POA: Diagnosis not present

## 2022-04-03 DIAGNOSIS — F413 Other mixed anxiety disorders: Secondary | ICD-10-CM | POA: Diagnosis not present

## 2022-04-03 DIAGNOSIS — Z8673 Personal history of transient ischemic attack (TIA), and cerebral infarction without residual deficits: Secondary | ICD-10-CM | POA: Diagnosis not present

## 2022-06-08 DIAGNOSIS — B0052 Herpesviral keratitis: Secondary | ICD-10-CM | POA: Diagnosis not present

## 2022-06-16 DIAGNOSIS — K449 Diaphragmatic hernia without obstruction or gangrene: Secondary | ICD-10-CM | POA: Diagnosis not present

## 2022-06-16 DIAGNOSIS — N6311 Unspecified lump in the right breast, upper outer quadrant: Secondary | ICD-10-CM | POA: Diagnosis not present

## 2022-06-16 DIAGNOSIS — N6310 Unspecified lump in the right breast, unspecified quadrant: Secondary | ICD-10-CM | POA: Diagnosis not present

## 2022-06-16 DIAGNOSIS — R109 Unspecified abdominal pain: Secondary | ICD-10-CM | POA: Diagnosis not present

## 2022-06-16 DIAGNOSIS — I700 Atherosclerosis of aorta: Secondary | ICD-10-CM | POA: Diagnosis not present

## 2022-06-16 DIAGNOSIS — I2510 Atherosclerotic heart disease of native coronary artery without angina pectoris: Secondary | ICD-10-CM | POA: Diagnosis not present

## 2022-06-16 DIAGNOSIS — W010XXA Fall on same level from slipping, tripping and stumbling without subsequent striking against object, initial encounter: Secondary | ICD-10-CM | POA: Diagnosis not present

## 2022-06-16 DIAGNOSIS — S2241XA Multiple fractures of ribs, right side, initial encounter for closed fracture: Secondary | ICD-10-CM | POA: Diagnosis not present

## 2022-06-16 DIAGNOSIS — Z880 Allergy status to penicillin: Secondary | ICD-10-CM | POA: Diagnosis not present

## 2022-07-08 DIAGNOSIS — D0439 Carcinoma in situ of skin of other parts of face: Secondary | ICD-10-CM | POA: Diagnosis not present

## 2022-08-20 DIAGNOSIS — X32XXXA Exposure to sunlight, initial encounter: Secondary | ICD-10-CM | POA: Diagnosis not present

## 2022-08-20 DIAGNOSIS — L570 Actinic keratosis: Secondary | ICD-10-CM | POA: Diagnosis not present

## 2022-09-07 DIAGNOSIS — I739 Peripheral vascular disease, unspecified: Secondary | ICD-10-CM | POA: Diagnosis not present

## 2022-09-07 DIAGNOSIS — M79674 Pain in right toe(s): Secondary | ICD-10-CM | POA: Diagnosis not present

## 2022-09-07 DIAGNOSIS — L851 Acquired keratosis [keratoderma] palmaris et plantaris: Secondary | ICD-10-CM | POA: Diagnosis not present

## 2022-09-07 DIAGNOSIS — M79675 Pain in left toe(s): Secondary | ICD-10-CM | POA: Diagnosis not present

## 2022-09-07 DIAGNOSIS — B351 Tinea unguium: Secondary | ICD-10-CM | POA: Diagnosis not present

## 2022-09-24 DIAGNOSIS — Z79899 Other long term (current) drug therapy: Secondary | ICD-10-CM | POA: Diagnosis not present

## 2022-09-24 DIAGNOSIS — E785 Hyperlipidemia, unspecified: Secondary | ICD-10-CM | POA: Diagnosis not present

## 2022-09-24 DIAGNOSIS — G459 Transient cerebral ischemic attack, unspecified: Secondary | ICD-10-CM | POA: Diagnosis not present

## 2022-09-24 DIAGNOSIS — F419 Anxiety disorder, unspecified: Secondary | ICD-10-CM | POA: Diagnosis not present

## 2022-10-02 DIAGNOSIS — R634 Abnormal weight loss: Secondary | ICD-10-CM | POA: Diagnosis not present

## 2022-10-02 DIAGNOSIS — Z8673 Personal history of transient ischemic attack (TIA), and cerebral infarction without residual deficits: Secondary | ICD-10-CM | POA: Diagnosis not present

## 2022-10-05 ENCOUNTER — Ambulatory Visit (INDEPENDENT_AMBULATORY_CARE_PROVIDER_SITE_OTHER): Payer: Medicare Other | Admitting: Adult Health

## 2022-10-05 ENCOUNTER — Encounter: Payer: Self-pay | Admitting: Adult Health

## 2022-10-05 VITALS — BP 121/71 | HR 68 | Ht 65.0 in | Wt 98.5 lb

## 2022-10-05 DIAGNOSIS — Z0000 Encounter for general adult medical examination without abnormal findings: Secondary | ICD-10-CM | POA: Insufficient documentation

## 2022-10-05 NOTE — Progress Notes (Signed)
  Subjective:     Patient ID: Rebecca Cordova, female   DOB: 11/11/1932, 89 y.o.   MRN: [id number]  HPI Rebecca Cordova is a 89 year old white female, PM, widowed, in complaining of ? Lump in right breast. She fell and had fractured ribs on the right she says.  PCP is Dr Ouida Sills  Review of Systems ? Lump in right breast Reviewed past medical,surgical, social and family history. Reviewed medications and allergies.     Objective:   Physical Exam BP 121/71 (BP Location: Left Arm, Patient Position: Sitting, Cuff Size: Normal)   Pulse 68   Ht 5\' 5"  (1.651 m)   Wt 98 lb 8 oz (44.7 kg)   BMI 16.39 kg/m      Skin warm and dry,  Breasts:no dominate palpable mass, retraction or nipple discharge  AA is 0 Fall risk is moderate    10/05/2022   11:10 AM  Depression screen PHQ 2/9  Decreased Interest 0  Down, Depressed, Hopeless 0  PHQ - 2 Score 0  Altered sleeping 0  Tired, decreased energy 1  Change in appetite 0  Feeling bad or failure about yourself  0  Trouble concentrating 0  Moving slowly or fidgety/restless 0  Suicidal thoughts 0  PHQ-9 Score 1       10/05/2022   11:11 AM  GAD 7 : Generalized Anxiety Score  Nervous, Anxious, on Edge 0  Control/stop worrying 0  Worry too much - different things 0  Trouble relaxing 1  Restless 1  Easily annoyed or irritable 0  Afraid - awful might happen 0  Total GAD 7 Score 2      Upstream - 10/05/22 1117       Pregnancy Intention Screening   Does the patient want to become pregnant in the next year? N/A    Does the patient's partner want to become pregnant in the next year? N/A    Would the patient like to discuss contraceptive options today? N/A      Contraception Wrap Up   Current Method Abstinence    End Method Abstinence    Contraception Counseling Provided No             Assessment:     1. Normal breast exam No masses felt    Plan:     Follow up prn

## 2022-11-12 DIAGNOSIS — L570 Actinic keratosis: Secondary | ICD-10-CM | POA: Diagnosis not present

## 2022-11-12 DIAGNOSIS — L980 Pyogenic granuloma: Secondary | ICD-10-CM | POA: Diagnosis not present

## 2022-11-12 DIAGNOSIS — X32XXXD Exposure to sunlight, subsequent encounter: Secondary | ICD-10-CM | POA: Diagnosis not present

## 2022-11-16 DIAGNOSIS — M79675 Pain in left toe(s): Secondary | ICD-10-CM | POA: Diagnosis not present

## 2022-11-16 DIAGNOSIS — B351 Tinea unguium: Secondary | ICD-10-CM | POA: Diagnosis not present

## 2022-11-16 DIAGNOSIS — L851 Acquired keratosis [keratoderma] palmaris et plantaris: Secondary | ICD-10-CM | POA: Diagnosis not present

## 2022-11-16 DIAGNOSIS — I739 Peripheral vascular disease, unspecified: Secondary | ICD-10-CM | POA: Diagnosis not present

## 2022-11-16 DIAGNOSIS — M79674 Pain in right toe(s): Secondary | ICD-10-CM | POA: Diagnosis not present

## 2022-12-07 DIAGNOSIS — L980 Pyogenic granuloma: Secondary | ICD-10-CM | POA: Diagnosis not present

## 2022-12-17 DIAGNOSIS — L980 Pyogenic granuloma: Secondary | ICD-10-CM | POA: Diagnosis not present

## 2022-12-17 DIAGNOSIS — X32XXXD Exposure to sunlight, subsequent encounter: Secondary | ICD-10-CM | POA: Diagnosis not present

## 2022-12-17 DIAGNOSIS — L570 Actinic keratosis: Secondary | ICD-10-CM | POA: Diagnosis not present

## 2023-01-20 ENCOUNTER — Other Ambulatory Visit: Payer: Self-pay

## 2023-01-20 ENCOUNTER — Encounter (HOSPITAL_COMMUNITY): Payer: Self-pay

## 2023-01-20 ENCOUNTER — Emergency Department (HOSPITAL_COMMUNITY)
Admission: EM | Admit: 2023-01-20 | Discharge: 2023-01-20 | Disposition: A | Discharge status: Home / Self Care | Payer: Medicare Other | Attending: Emergency Medicine | Admitting: Emergency Medicine

## 2023-01-20 DIAGNOSIS — X58XXXA Exposure to other specified factors, initial encounter: Secondary | ICD-10-CM | POA: Diagnosis not present

## 2023-01-20 DIAGNOSIS — Z79899 Other long term (current) drug therapy: Secondary | ICD-10-CM | POA: Insufficient documentation

## 2023-01-20 DIAGNOSIS — I10 Essential (primary) hypertension: Secondary | ICD-10-CM | POA: Diagnosis not present

## 2023-01-20 DIAGNOSIS — H5789 Other specified disorders of eye and adnexa: Secondary | ICD-10-CM | POA: Diagnosis present

## 2023-01-20 DIAGNOSIS — S0502XA Injury of conjunctiva and corneal abrasion without foreign body, left eye, initial encounter: Secondary | ICD-10-CM | POA: Diagnosis not present

## 2023-01-20 MED ORDER — SULFACETAMIDE SODIUM 10 % OP SOLN
1.0000 [drp] | OPHTHALMIC | Status: DC
  Administered 2023-01-20: 1 [drp] via OPHTHALMIC
  Filled 2023-01-20: qty 15

## 2023-01-20 MED ORDER — TETRACAINE HCL 0.5 % OP SOLN
2.0000 [drp] | Freq: Once | OPHTHALMIC | Status: AC
  Administered 2023-01-20: 2 [drp] via OPHTHALMIC
  Filled 2023-01-20: qty 4

## 2023-01-20 MED ORDER — FLUORESCEIN SODIUM 1 MG OP STRP
1.0000 | ORAL_STRIP | Freq: Once | OPHTHALMIC | Status: AC
  Administered 2023-01-20: 1 via OPHTHALMIC
  Filled 2023-01-20: qty 1

## 2023-01-20 NOTE — ED Provider Notes (Signed)
[place] EMERGENCY DEPARTMENT AT [facility] Provider Note   CSN: [id number] Arrival date & time: 01/20/23  1048     History  Chief Complaint  Patient presents with   Eye Problem    Left eye    Rebecca Cordova is a 89 y.o. female.   Eye Problem Patient presents with eye pain and blurry vision for the last few days.  Left eye.  States she has chronically decreased vision in this eye.  Has had some surgery on her eye before but does not know what it is.    Past Medical History:  Diagnosis Date   Arthritis    Hypertension    Osteoporosis    Past Surgical History:  Procedure Laterality Date   APPENDECTOMY     CHOLECYSTECTOMY     EYE SURGERY     OVARIAN CYST REMOVAL     TONSILLECTOMY      Home Medications Prior to Admission medications   Medication Sig Start Date End Date Taking? Authorizing Provider  acetaminophen (TYLENOL) 500 MG tablet Take 1,000 mg by mouth every 6 (six) hours as needed. Pain    [provider]  aspirin EC 81 MG tablet Take 81 mg by mouth daily.    [provider]  lisinopril (PRINIVIL,ZESTRIL) 10 MG tablet Take 10 mg by mouth daily as needed (patient takes when she feels like her blood pressure is up.).    [provider]  losartan (COZAAR) 50 MG tablet Take 50 mg by mouth daily. 09/04/22   [provider]  Multiple Vitamin (MULTIVITAMIN WITH MINERALS) TABS Take 1 tablet by mouth daily.    [provider]  POTASSIUM PO Take by mouth.    [provider]      Allergies    Penicillins    Review of Systems   Review of Systems  Physical Exam Updated Vital Signs BP 128/67 (BP Location: Left Arm)   Pulse 65   Temp (!) 97.5 F (36.4 C) (Oral)   Resp 16   Ht 5\' 5"  (1.651 m)   Wt 45.8 kg   SpO2 99%   BMI 16.81 kg/m  Physical Exam Vitals and nursing note reviewed.  Eyes:     General: No scleral icterus.    Extraocular Movements: Extraocular movements intact.     Pupils: Pupils  are equal, round, and reactive to light.     Comments: Slight conjunctival injection on left.  Pupil reactive.  Unable to get pressure reading with Tono-Pen.  On slit-lamp exam does have linear irritation of the cornea inferiorly.  Could be postsurgical.  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  Neurological:     Mental Status: She is alert.     ED Results / Procedures / Treatments   Labs (all labs ordered are listed, but only abnormal results are displayed) Labs Reviewed - No data to display  EKG None  Radiology No results found.  Procedures Procedures    Medications Ordered in ED Medications  sulfacetamide (BLEPH-10) 10 % ophthalmic solution 1 drop (has no administration in time range)  tetracaine (PONTOCAINE) 0.5 % ophthalmic solution 2 drop (2 drops Left Eye Given 01/20/23 1237)  fluorescein ophthalmic strip 1 strip (1 strip Left Eye Given by Other 01/20/23 1237)    ED Course/ Medical Decision Making/ A&P  Medical Decision Making Risk Prescription drug management.   Patient with left eye pain and blurring.  Had previous had a steroid drop.  Some inferior uptake on the cornea.  Potentially scarring versus abrasion.  Does not clearly seem herpetic.        Final Clinical Impression(s) / ED Diagnoses Final diagnoses:  Abrasion of left cornea, initial encounter    Rx / DC Orders ED Discharge Orders     None         Benjiman Core, MD 01/20/23 1253

## 2023-01-20 NOTE — ED Triage Notes (Signed)
Pt states "Has had blurry vision for the past three years, but it has gotten really itchy, and sore with drainage and increased blurred vision to where it is difficult to see with it." Pt has taken predinisilone eye drops she had previously received from her eye doctor "a long time ago", but they have not helped.

## 2023-01-20 NOTE — Discharge Instructions (Signed)
Take the drops 4 times a day.  Follow-up with the eye doctor.

## 2023-02-01 DIAGNOSIS — M79674 Pain in right toe(s): Secondary | ICD-10-CM | POA: Diagnosis not present

## 2023-02-01 DIAGNOSIS — L851 Acquired keratosis [keratoderma] palmaris et plantaris: Secondary | ICD-10-CM | POA: Diagnosis not present

## 2023-02-01 DIAGNOSIS — B351 Tinea unguium: Secondary | ICD-10-CM | POA: Diagnosis not present

## 2023-02-01 DIAGNOSIS — M79675 Pain in left toe(s): Secondary | ICD-10-CM | POA: Diagnosis not present

## 2023-02-01 DIAGNOSIS — I739 Peripheral vascular disease, unspecified: Secondary | ICD-10-CM | POA: Diagnosis not present

## 2023-02-18 DIAGNOSIS — Z1231 Encounter for screening mammogram for malignant neoplasm of breast: Secondary | ICD-10-CM | POA: Diagnosis not present

## 2023-03-23 DIAGNOSIS — Z23 Encounter for immunization: Secondary | ICD-10-CM | POA: Diagnosis not present

## 2023-04-05 DIAGNOSIS — R634 Abnormal weight loss: Secondary | ICD-10-CM | POA: Diagnosis not present

## 2023-04-05 DIAGNOSIS — G459 Transient cerebral ischemic attack, unspecified: Secondary | ICD-10-CM | POA: Diagnosis not present

## 2023-05-10 DIAGNOSIS — B351 Tinea unguium: Secondary | ICD-10-CM | POA: Diagnosis not present

## 2023-05-10 DIAGNOSIS — I739 Peripheral vascular disease, unspecified: Secondary | ICD-10-CM | POA: Diagnosis not present

## 2023-05-10 DIAGNOSIS — M79675 Pain in left toe(s): Secondary | ICD-10-CM | POA: Diagnosis not present

## 2023-05-10 DIAGNOSIS — M79674 Pain in right toe(s): Secondary | ICD-10-CM | POA: Diagnosis not present

## 2023-05-10 DIAGNOSIS — L851 Acquired keratosis [keratoderma] palmaris et plantaris: Secondary | ICD-10-CM | POA: Diagnosis not present

## 2023-05-18 ENCOUNTER — Emergency Department (HOSPITAL_COMMUNITY): Payer: Medicare Other

## 2023-05-18 ENCOUNTER — Emergency Department (HOSPITAL_COMMUNITY)
Admission: EM | Admit: 2023-05-18 | Discharge: 2023-05-18 | Disposition: A | Discharge status: Home / Self Care | Payer: Medicare Other | Attending: Emergency Medicine | Admitting: Emergency Medicine

## 2023-05-18 ENCOUNTER — Other Ambulatory Visit: Payer: Self-pay

## 2023-05-18 ENCOUNTER — Encounter (HOSPITAL_COMMUNITY): Payer: Self-pay | Admitting: Emergency Medicine

## 2023-05-18 DIAGNOSIS — I82411 Acute embolism and thrombosis of right femoral vein: Secondary | ICD-10-CM | POA: Diagnosis not present

## 2023-05-18 DIAGNOSIS — G8929 Other chronic pain: Secondary | ICD-10-CM | POA: Diagnosis not present

## 2023-05-18 DIAGNOSIS — I10 Essential (primary) hypertension: Secondary | ICD-10-CM | POA: Diagnosis not present

## 2023-05-18 DIAGNOSIS — Z7901 Long term (current) use of anticoagulants: Secondary | ICD-10-CM | POA: Insufficient documentation

## 2023-05-18 DIAGNOSIS — M7989 Other specified soft tissue disorders: Secondary | ICD-10-CM | POA: Diagnosis not present

## 2023-05-18 DIAGNOSIS — R2240 Localized swelling, mass and lump, unspecified lower limb: Secondary | ICD-10-CM | POA: Diagnosis present

## 2023-05-18 DIAGNOSIS — M25571 Pain in right ankle and joints of right foot: Secondary | ICD-10-CM | POA: Diagnosis not present

## 2023-05-18 MED ORDER — APIXABAN (ELIQUIS) VTE STARTER PACK (10MG AND 5MG): ORAL_TABLET | ORAL | 0 refills | Status: AC

## 2023-05-18 MED ORDER — APIXABAN 5 MG PO TABS
10.0000 mg | ORAL_TABLET | Freq: Once | ORAL | Status: AC
  Administered 2023-05-18: 10 mg via ORAL
  Filled 2023-05-18: qty 2

## 2023-05-18 NOTE — ED Triage Notes (Addendum)
Pt c/o of RLE swelling for a unknown amount of time. No pain or redness noted. Denies hx of blood clots.

## 2023-05-18 NOTE — Discharge Instructions (Addendum)
Ultrasound today did show a blood clot in your right leg.  A prescription for a blood thinning medication was sent to your pharmacy.  Take this as prescribed.  For now, stop use of aspirin to minimize risk of bleeding.  Follow-up with Dr. Ouida Sills for reassessment and prescription refills.  Elevate your right leg when possible.  Compression socks can also help when you are up on your feet.  Return to the emergency department for any new or worsening symptoms of concern.

## 2023-05-18 NOTE — ED Provider Notes (Signed)
[place] EMERGENCY DEPARTMENT AT [facility] Provider Note   CSN: [id number] Arrival date & time: 05/18/23  1022     History  Chief Complaint  Patient presents with   Leg Swelling    Rebecca Cordova is a 89 y.o. female.  HPI Patient presents for right leg swelling.  Medical history includes arthritis, HTN, osteoporosis.  She states that swelling has been ongoing for "a good while".  She is not able to further specify.  She denies any significant pain with the swelling.  She states that she is typically on the go and on her feet a lot.  She has not been wearing compression stockings or elevating her leg.  She denies any other symptoms of concern.    Home Medications Prior to Admission medications   Medication Sig Start Date End Date Taking? Authorizing Provider  APIXABAN (ELIQUIS) VTE STARTER PACK (10MG  AND 5MG ) Take as directed on package: start with two-5mg  tablets twice daily for 7 days. On day 8, switch to one-5mg  tablet twice daily. 05/18/23  Yes Gloris Manchester, MD  acetaminophen (TYLENOL) 500 MG tablet Take 1,000 mg by mouth every 6 (six) hours as needed. Pain    [provider]  lisinopril (PRINIVIL,ZESTRIL) 10 MG tablet Take 10 mg by mouth daily as needed (patient takes when she feels like her blood pressure is up.).    [provider]  losartan (COZAAR) 50 MG tablet Take 50 mg by mouth daily. 09/04/22   [provider]  Multiple Vitamin (MULTIVITAMIN WITH MINERALS) TABS Take 1 tablet by mouth daily.    [provider]  POTASSIUM PO Take by mouth.    [provider]      Allergies    Penicillins    Review of Systems   Review of Systems  Cardiovascular:  Positive for leg swelling.  All other systems reviewed and are negative.   Physical Exam Updated Vital Signs BP (!) 108/59   Pulse 67   Temp 97.9 F (36.6 C) (Oral)   Resp 18   Ht 5\' 5"  (1.651 m)   Wt 45 kg   SpO2 97%   BMI 16.51 kg/m  Physical Exam Vitals  and nursing note reviewed.  Constitutional:      General: She is not in acute distress.    Appearance: Normal appearance. She is well-developed. She is not ill-appearing, toxic-appearing or diaphoretic.  HENT:     Head: Normocephalic and atraumatic.     Right Ear: External ear normal.     Left Ear: External ear normal.     Nose: Nose normal.     Mouth/Throat:     Mouth: Mucous membranes are moist.  Eyes:     Conjunctiva/sclera: Conjunctivae normal.  Cardiovascular:     Rate and Rhythm: Normal rate and regular rhythm.  Pulmonary:     Effort: Pulmonary effort is normal. No respiratory distress.  Abdominal:     General: There is no distension.     Palpations: Abdomen is soft.  Musculoskeletal:        General: No swelling. Normal range of motion.     Cervical back: Normal range of motion and neck supple.     Right lower leg: Edema present.     Left lower leg: No edema.  Skin:    General: Skin is warm and dry.     Coloration: Skin is not jaundiced or pale.  Neurological:     General: No focal deficit present.  Mental Status: She is alert and oriented to person, place, and time.  Psychiatric:        Mood and Affect: Mood normal.        Behavior: Behavior normal.     ED Results / Procedures / Treatments   Labs (all labs ordered are listed, but only abnormal results are displayed) Labs Reviewed - No data to display  EKG None  Radiology US Venous Img Lower Unilateral Right Result Date: 05/18/2023 CLINICAL DATA:  RLE swelling EXAM: Right LOWER EXTREMITY VENOUS DOPPLER ULTRASOUND TECHNIQUE: Gray-scale sonography with graded compression, as well as color Doppler and duplex ultrasound were performed to evaluate the lower extremity deep venous systems from the level of the common femoral vein and including the common femoral, femoral, profunda femoral, popliteal and calf veins including the posterior tibial, peroneal and gastrocnemius veins when visible. The superficial great  saphenous vein was also interrogated. Spectral Doppler was utilized to evaluate flow at rest and with distal augmentation maneuvers in the common femoral, femoral and popliteal veins. COMPARISON:  None Available. FINDINGS: Contralateral Common Femoral Vein: Respiratory phasicity is normal and symmetric with the symptomatic side. No evidence of thrombus. Normal compressibility. Common Femoral Vein: No evidence of thrombus. Normal compressibility, respiratory phasicity and response to augmentation. Saphenofemoral Junction: No evidence of thrombus. Normal compressibility and flow on color Doppler imaging. Profunda Femoral Vein: No evidence of thrombus. Normal compressibility and flow on color Doppler imaging. Femoral Vein: Nonocclusive deep venous thrombosis of the right femoral vein. Popliteal Vein: No evidence of thrombus. Normal compressibility, respiratory phasicity and response to augmentation. Calf Veins: No evidence of thrombus. Normal compressibility and flow on color Doppler imaging. Superficial Great Saphenous Vein: No evidence of thrombus. Normal compressibility. Venous Reflux:  None. Other Findings:  None. IMPRESSION: Nonocclusive deep venous thrombosis of the right femoral vein. Electronically Signed   By: Tish Frederickson M.D.   On: 05/18/2023 17:08   DG Ankle Complete Right Result Date: 05/18/2023 CLINICAL DATA:  Chronic right ankle pain and swelling without known injury. EXAM: RIGHT ANKLE - COMPLETE 3+ VIEW COMPARISON:  None Available. FINDINGS: There is no evidence of fracture, dislocation, or joint effusion. There is no evidence of arthropathy or other focal bone abnormality. Soft tissues are unremarkable. IMPRESSION: Negative. Electronically Signed   By: Lupita Raider M.D.   On: 05/18/2023 13:17    Procedures Procedures    Medications Ordered in ED Medications  apixaban (ELIQUIS) tablet 10 mg (has no administration in time range)    ED Course/ Medical Decision Making/ A&P                                  Medical Decision Making Risk Prescription drug management.   Patient presenting for right leg swelling.  On exam, she does have some pitting edema to distal RLE.  She denies any tenderness.  There is no concerning erythema or warmth to her leg.  Ankle x-ray, obtained while in triage, was negative for acute findings.  Will check for DVT.  Alternatively, patient could have some venous insufficiency.  DVT did show nonocclusive femoral DVT.  Patient to be started on Eliquis.  She does have a PCP that she can follow-up with.  For now, will take her off of ASA to minimize risk of bleeding.  Patient was discharged in stable condition.        Final Clinical Impression(s) / ED Diagnoses Final diagnoses:  Acute deep vein thrombosis (DVT) of femoral vein of right lower extremity (HCC)    Rx / DC Orders ED Discharge Orders          Ordered    APIXABAN (ELIQUIS) VTE STARTER PACK (10MG  AND 5MG )       Note to Pharmacy: If starter pack unavailable, substitute with seventy-four 5 mg apixaban tabs following the above SIG directions.   05/18/23 1737              Gloris Manchester, MD 05/18/23 1739

## 2023-05-18 NOTE — ED Provider Triage Note (Signed)
Emergency Medicine Provider Triage Evaluation Note  Rebecca Cordova , a 89 y.o. female  was evaluated in triage.  Pt complains of right ankle pain and swelling Hx of arthritis  Review of Systems  Positive: Leg swelling Negative: fever  Physical Exam  BP (!) 108/59   Pulse 67   Temp 97.9 F (36.6 C) (Oral)   Resp 18   Ht 5\' 5"  (1.651 m)   Wt 45 kg   SpO2 97%   BMI 16.51 kg/m  Gen:   Awake, no distress   Resp:  Normal effort  MSK:   Moves extremities without difficulty  Other:  Swelling left ankle  Deformity fingers  Medical Decision Making  Medically screening exam initiated at 12:34 PM.  Appropriate orders placed.  Rebecca Cordova was informed that the remainder of the evaluation will be completed by another provider, this initial triage assessment does not replace that evaluation, and the importance of remaining in the ED until their evaluation is complete.     Elson Areas, [place] 05/18/23 1235

## 2023-05-25 DIAGNOSIS — I82401 Acute embolism and thrombosis of unspecified deep veins of right lower extremity: Secondary | ICD-10-CM | POA: Diagnosis not present

## 2023-06-15 DIAGNOSIS — I82401 Acute embolism and thrombosis of unspecified deep veins of right lower extremity: Secondary | ICD-10-CM | POA: Diagnosis not present

## 2023-07-12 DIAGNOSIS — D044 Carcinoma in situ of skin of scalp and neck: Secondary | ICD-10-CM | POA: Diagnosis not present

## 2023-07-26 DIAGNOSIS — B351 Tinea unguium: Secondary | ICD-10-CM | POA: Diagnosis not present

## 2023-07-26 DIAGNOSIS — M79675 Pain in left toe(s): Secondary | ICD-10-CM | POA: Diagnosis not present

## 2023-07-26 DIAGNOSIS — I739 Peripheral vascular disease, unspecified: Secondary | ICD-10-CM | POA: Diagnosis not present

## 2023-07-26 DIAGNOSIS — M79674 Pain in right toe(s): Secondary | ICD-10-CM | POA: Diagnosis not present

## 2023-07-26 DIAGNOSIS — L851 Acquired keratosis [keratoderma] palmaris et plantaris: Secondary | ICD-10-CM | POA: Diagnosis not present

## 2023-09-27 DIAGNOSIS — I82411 Acute embolism and thrombosis of right femoral vein: Secondary | ICD-10-CM | POA: Diagnosis not present

## 2023-09-27 DIAGNOSIS — Z79899 Other long term (current) drug therapy: Secondary | ICD-10-CM | POA: Diagnosis not present

## 2023-09-27 DIAGNOSIS — R636 Underweight: Secondary | ICD-10-CM | POA: Diagnosis not present

## 2023-09-27 DIAGNOSIS — G459 Transient cerebral ischemic attack, unspecified: Secondary | ICD-10-CM | POA: Diagnosis not present

## 2023-10-04 DIAGNOSIS — Z8673 Personal history of transient ischemic attack (TIA), and cerebral infarction without residual deficits: Secondary | ICD-10-CM | POA: Diagnosis not present

## 2023-10-04 DIAGNOSIS — I82401 Acute embolism and thrombosis of unspecified deep veins of right lower extremity: Secondary | ICD-10-CM | POA: Diagnosis not present

## 2023-10-08 ENCOUNTER — Encounter (HOSPITAL_COMMUNITY): Payer: Self-pay

## 2023-10-08 ENCOUNTER — Emergency Department (HOSPITAL_COMMUNITY)

## 2023-10-08 ENCOUNTER — Other Ambulatory Visit: Payer: Self-pay

## 2023-10-08 ENCOUNTER — Emergency Department (HOSPITAL_COMMUNITY)
Admission: EM | Admit: 2023-10-08 | Discharge: 2023-10-08 | Disposition: A | Discharge status: Home / Self Care | Attending: Emergency Medicine | Admitting: Emergency Medicine

## 2023-10-08 DIAGNOSIS — M25561 Pain in right knee: Secondary | ICD-10-CM

## 2023-10-08 DIAGNOSIS — M25461 Effusion, right knee: Secondary | ICD-10-CM | POA: Diagnosis not present

## 2023-10-08 DIAGNOSIS — M1711 Unilateral primary osteoarthritis, right knee: Secondary | ICD-10-CM | POA: Diagnosis not present

## 2023-10-08 MED ORDER — ACETAMINOPHEN ER 650 MG PO TBCR: 650.0000 mg | EXTENDED_RELEASE_TABLET | Freq: Three times a day (TID) | ORAL | 0 refills | Status: AC | PRN

## 2023-10-08 NOTE — ED Provider Notes (Cosign Needed)
 [place] EMERGENCY DEPARTMENT AT [facility] Provider Note   CSN: [id number] Arrival date & time: 10/08/23  1607     Patient presents with: Knee Pain   Rebecca Cordova is a 89 y.o. female.    Knee Pain Associated symptoms: no back pain and no fever         Rebecca Cordova is a 89 y.o. female who presents to the Emergency Department complaining of right knee pain for years, states worsening for 2 to 3 months.  She is having pain to the medial aspect of the right knee.  Pain is worse with walking and weightbearing.  She denies any injury, excessive warmth or redness of her knee.  Takes Tylenol with minimal relief.  She notes history of arthritis to both knees and both hands   Prior to Admission medications   Medication Sig Start Date End Date Taking? Authorizing Provider  acetaminophen (TYLENOL) 500 MG tablet Take 1,000 mg by mouth every 6 (six) hours as needed. Pain    [provider]  APIXABAN  (ELIQUIS ) VTE STARTER PACK (10MG  AND 5MG ) Take as directed on package: start with two-5mg  tablets twice daily for 7 days. On day 8, switch to one-5mg  tablet twice daily. 05/18/23   Iva Mariner, MD  lisinopril (PRINIVIL,ZESTRIL) 10 MG tablet Take 10 mg by mouth daily as needed (patient takes when she feels like her blood pressure is up.).    [provider]  losartan (COZAAR) 50 MG tablet Take 50 mg by mouth daily. 09/04/22   [provider]  Multiple Vitamin (MULTIVITAMIN WITH MINERALS) TABS Take 1 tablet by mouth daily.    [provider]  POTASSIUM PO Take by mouth.    [provider]    Allergies: Penicillins    Review of Systems  Constitutional:  Negative for chills and fever.  Respiratory:  Negative for shortness of breath.   Cardiovascular:  Negative for chest pain.  Gastrointestinal:  Negative for abdominal pain, nausea and vomiting.  Musculoskeletal:  Positive for arthralgias (Right knee pain). Negative for back pain.  Skin:   Negative for color change, rash and wound.  Neurological:  Negative for weakness and numbness.  Psychiatric/Behavioral:  Negative for confusion.     Updated Vital Signs BP 127/66   Pulse 82   Temp 97.7 F (36.5 C) (Temporal)   Resp 18   Ht 5' 5 (1.651 m)   Wt 44 kg   SpO2 94%   BMI 16.14 kg/m   Physical Exam Vitals and nursing note reviewed.  Constitutional:      General: She is not in acute distress.    Appearance: Normal appearance. She is not ill-appearing or toxic-appearing.   Cardiovascular:     Rate and Rhythm: Normal rate and regular rhythm.     Pulses: Normal pulses.  Pulmonary:     Effort: Pulmonary effort is normal.     Breath sounds: Normal breath sounds.   Musculoskeletal:        General: Tenderness present. No swelling, deformity or signs of injury.     Right knee: No effusion, erythema or crepitus. Tenderness present over the medial joint line. No patellar tendon tenderness. Normal alignment, normal meniscus and normal patellar mobility. Normal pulse.     Right lower leg: No edema.     Left lower leg: No edema.     Comments: Patient has tenderness with range of motion medial aspect of the right knee.  No obvious ligamentous instability on exam.  No calf pain or swelling.  No erythema or excessive warmth of the joint.   Skin:    General: Skin is warm.     Capillary Refill: Capillary refill takes less than 2 seconds.   Neurological:     Mental Status: She is alert.     (all labs ordered are listed, but only abnormal results are displayed) Labs Reviewed - No data to display  EKG: None  Radiology: DG Knee Complete 4 Views Right Result Date: 10/08/2023 CLINICAL DATA:  Pain and swelling. 2-3 months of right knee pain increased last week. No known recent injury. EXAM: RIGHT KNEE - COMPLETE 4+ VIEW COMPARISON:  Right knee radiographs 09/15/2017 FINDINGS: Minimal medial compartment joint space narrowing. Mild peripheral lateral compartment degenerative  osteophytosis without significant joint space narrowing. Moderate lateral and mild medial compartment chondrocalcinosis. Moderate patellofemoral joint space narrowing with mild superior patellar degenerative spurring. Tiny joint effusion. No acute fracture or dislocation. Interval increase in moderate atherosclerotic calcifications. IMPRESSION: 1. Mild-to-moderate patellofemoral and minimal medial compartment osteoarthritis. 2. Moderate lateral and mild medial compartment chondrocalcinosis. Electronically Signed   By: Bertina Broccoli M.D.   On: 10/08/2023 17:14     Procedures   Medications Ordered in the ED - No data to display                                  Medical Decision Making Patient here with chronic pain right knee.  No known injury.  Pain has been worsening for 2 to 3 months.  Pain is positional, denies redness, excessive warmth or edema  Patient is well-appearing on exam, vital signs are reassuring.  She has significant arthritic appearing knees with right greater than left.  I do not appreciate any palpable effusion or obvious ligamentous instability on exam.  Low clinical suspicion for fracture, dislocation or septic joint.  I suspect this is related to inflammatory process.  Will obtain imaging  Amount and/or Complexity of Data Reviewed Radiology: ordered.    Details: X-ray of the knee shows mild to moderate patellofemoral and medial compartment OA Discussion of management or test interpretation with external provider(s): Discussed x-ray findings with the patient, will provide knee brace for added support when standing or walking.  Doubt emergent process. Will recommend arthritis strength Tylenol and ortho f/u  Risk OTC drugs.        Final diagnoses:  Acute pain of right knee    ED Discharge Orders     None          Catherne Clubs, [place] 10/08/23 1948

## 2023-10-08 NOTE — ED Triage Notes (Signed)
 Pt presents with 2-3 month hx of R knee pain and in the last week pain and swelling increased. Denies any recent injury.

## 2023-10-08 NOTE — Discharge Instructions (Signed)
 Wear the knee brace as needed for support when standing or walking, you may remove at rest, bathing and at bedtime.  Please call the orthopedic provider listed on Monday to arrange follow-up appointment

## 2023-10-11 DIAGNOSIS — L851 Acquired keratosis [keratoderma] palmaris et plantaris: Secondary | ICD-10-CM | POA: Diagnosis not present

## 2023-10-11 DIAGNOSIS — M79675 Pain in left toe(s): Secondary | ICD-10-CM | POA: Diagnosis not present

## 2023-10-11 DIAGNOSIS — M79674 Pain in right toe(s): Secondary | ICD-10-CM | POA: Diagnosis not present

## 2023-10-11 DIAGNOSIS — I739 Peripheral vascular disease, unspecified: Secondary | ICD-10-CM | POA: Diagnosis not present

## 2023-10-11 DIAGNOSIS — B351 Tinea unguium: Secondary | ICD-10-CM | POA: Diagnosis not present

## 2024-01-22 DIAGNOSIS — Z23 Encounter for immunization: Secondary | ICD-10-CM | POA: Diagnosis not present

## 2024-02-21 DIAGNOSIS — I82401 Acute embolism and thrombosis of unspecified deep veins of right lower extremity: Secondary | ICD-10-CM | POA: Diagnosis not present

## 2024-02-21 DIAGNOSIS — L700 Acne vulgaris: Secondary | ICD-10-CM | POA: Diagnosis not present
